# Patient Record
Sex: Male | Born: 1960 | Race: Black or African American | Hispanic: No | Marital: Single | Smoking: Current every day smoker
Health system: Southern US, Community
[De-identification: ages and names within clinical notes are randomized; demographics above are authoritative.]

## PROBLEM LIST (undated history)

## (undated) DIAGNOSIS — F1010 Alcohol abuse, uncomplicated: Secondary | ICD-10-CM

## (undated) DIAGNOSIS — C801 Malignant (primary) neoplasm, unspecified: Secondary | ICD-10-CM

## (undated) HISTORY — PX: ELBOW SURGERY: SHX618

---

## 1997-06-21 ENCOUNTER — Emergency Department (HOSPITAL_COMMUNITY): Admission: EM | Admit: 1997-06-21 | Discharge: 1997-06-21 | Payer: Self-pay | Admitting: Emergency Medicine

## 1998-04-29 ENCOUNTER — Emergency Department (HOSPITAL_COMMUNITY): Admission: EM | Admit: 1998-04-29 | Discharge: 1998-04-29 | Payer: Self-pay | Admitting: Emergency Medicine

## 1998-04-29 ENCOUNTER — Encounter: Payer: Self-pay | Admitting: *Deleted

## 1999-05-17 ENCOUNTER — Encounter: Payer: Self-pay | Admitting: Emergency Medicine

## 1999-05-17 ENCOUNTER — Inpatient Hospital Stay (HOSPITAL_COMMUNITY): Admission: EM | Admit: 1999-05-17 | Discharge: 1999-05-18 | Payer: Self-pay | Admitting: Emergency Medicine

## 2002-06-10 ENCOUNTER — Emergency Department (HOSPITAL_COMMUNITY): Admission: EM | Admit: 2002-06-10 | Discharge: 2002-06-10 | Payer: Self-pay | Admitting: Emergency Medicine

## 2005-12-09 ENCOUNTER — Emergency Department (HOSPITAL_COMMUNITY): Admission: EM | Admit: 2005-12-09 | Discharge: 2005-12-10 | Payer: Self-pay | Admitting: Emergency Medicine

## 2007-01-11 ENCOUNTER — Emergency Department (HOSPITAL_COMMUNITY): Admission: EM | Admit: 2007-01-11 | Discharge: 2007-01-11 | Payer: Self-pay | Admitting: Emergency Medicine

## 2008-11-09 ENCOUNTER — Emergency Department (HOSPITAL_COMMUNITY): Admission: EM | Admit: 2008-11-09 | Discharge: 2008-11-09 | Payer: Self-pay | Admitting: Emergency Medicine

## 2009-08-13 ENCOUNTER — Emergency Department (HOSPITAL_COMMUNITY): Admission: EM | Admit: 2009-08-13 | Discharge: 2009-08-13 | Payer: Self-pay | Admitting: Emergency Medicine

## 2010-05-20 LAB — POCT I-STAT, CHEM 8
BUN: 22 mg/dL (ref 6–23)
Calcium, Ion: 0.89 mmol/L — ABNORMAL LOW (ref 1.12–1.32)
Chloride: 93 mEq/L — ABNORMAL LOW (ref 96–112)
HCT: 56 % — ABNORMAL HIGH (ref 39.0–52.0)
Potassium: 3 mEq/L — ABNORMAL LOW (ref 3.5–5.1)

## 2010-05-20 LAB — RAPID URINE DRUG SCREEN, HOSP PERFORMED
Amphetamines: NOT DETECTED
Barbiturates: NOT DETECTED
Benzodiazepines: NOT DETECTED
Cocaine: NOT DETECTED
Opiates: NOT DETECTED

## 2010-05-20 LAB — CBC
Platelets: 92 10*3/uL — ABNORMAL LOW (ref 150–400)
RBC: 5.39 MIL/uL (ref 4.22–5.81)
WBC: 11.1 10*3/uL — ABNORMAL HIGH (ref 4.0–10.5)

## 2010-05-20 LAB — URINALYSIS, ROUTINE W REFLEX MICROSCOPIC
Glucose, UA: NEGATIVE mg/dL
Glucose, UA: NEGATIVE mg/dL
Glucose, UA: NEGATIVE mg/dL
Hgb urine dipstick: NEGATIVE
Hgb urine dipstick: NEGATIVE
Hgb urine dipstick: NEGATIVE
Ketones, ur: 15 mg/dL — AB
Ketones, ur: 15 mg/dL — AB
Protein, ur: 100 mg/dL — AB
Protein, ur: NEGATIVE mg/dL
Specific Gravity, Urine: 1.029 (ref 1.005–1.030)
Urobilinogen, UA: 1 mg/dL (ref 0.0–1.0)
pH: 5 (ref 5.0–8.0)

## 2010-05-20 LAB — URINE MICROSCOPIC-ADD ON

## 2010-05-20 LAB — DIFFERENTIAL
Basophils Relative: 1 % (ref 0–1)
Eosinophils Absolute: 0 10*3/uL (ref 0.0–0.7)
Lymphs Abs: 1.6 10*3/uL (ref 0.7–4.0)
Monocytes Relative: 10 % (ref 3–12)
Neutro Abs: 8.4 10*3/uL — ABNORMAL HIGH (ref 1.7–7.7)
Neutrophils Relative %: 75 % (ref 43–77)

## 2010-05-20 LAB — HEPATIC FUNCTION PANEL
AST: 117 U/L — ABNORMAL HIGH (ref 0–37)
Albumin: 3.9 g/dL (ref 3.5–5.2)
Alkaline Phosphatase: 101 U/L (ref 39–117)
Total Bilirubin: 3.1 mg/dL — ABNORMAL HIGH (ref 0.3–1.2)
Total Protein: 7.7 g/dL (ref 6.0–8.3)

## 2010-05-20 LAB — BASIC METABOLIC PANEL
BUN: 17 mg/dL (ref 6–23)
CO2: 34 mEq/L — ABNORMAL HIGH (ref 19–32)
Chloride: 97 mEq/L (ref 96–112)
Creatinine, Ser: 1.31 mg/dL (ref 0.4–1.5)
GFR calc Af Amer: 43 mL/min — ABNORMAL LOW (ref >60)
GFR calc Af Amer: >60 mL/min (ref >60)
GFR calc non Af Amer: 58 mL/min — ABNORMAL LOW (ref >60)
Potassium: 3 mEq/L — ABNORMAL LOW (ref 3.5–5.1)
Potassium: 3.2 mEq/L — ABNORMAL LOW (ref 3.5–5.1)

## 2010-05-20 LAB — LIPASE, BLOOD: Lipase: 70 U/L — ABNORMAL HIGH (ref 11–59)

## 2010-05-20 LAB — MAGNESIUM: Magnesium: 1.5 mg/dL (ref 1.5–2.5)

## 2010-06-07 LAB — COMPREHENSIVE METABOLIC PANEL
ALT: 100 U/L — ABNORMAL HIGH (ref 0–53)
Albumin: 3.7 g/dL (ref 3.5–5.2)
Alkaline Phosphatase: 67 U/L (ref 39–117)
Potassium: 3.7 mEq/L (ref 3.5–5.1)
Sodium: 139 mEq/L (ref 135–145)
Total Protein: 7.1 g/dL (ref 6.0–8.3)

## 2010-06-07 LAB — DIFFERENTIAL
Basophils Relative: 1 % (ref 0–1)
Eosinophils Absolute: 0.1 10*3/uL (ref 0.0–0.7)
Eosinophils Relative: 2 % (ref 0–5)
Monocytes Absolute: 0.8 10*3/uL (ref 0.1–1.0)
Monocytes Relative: 12 % (ref 3–12)

## 2010-06-07 LAB — URINALYSIS, ROUTINE W REFLEX MICROSCOPIC
Glucose, UA: NEGATIVE mg/dL
Hgb urine dipstick: NEGATIVE
Ketones, ur: NEGATIVE mg/dL
Protein, ur: NEGATIVE mg/dL

## 2010-06-07 LAB — CBC
Hemoglobin: 16.7 g/dL (ref 13.0–17.0)
Platelets: 135 10*3/uL — ABNORMAL LOW (ref 150–400)
RDW: 14.8 % (ref 11.5–15.5)
WBC: 6.6 10*3/uL (ref 4.0–10.5)

## 2010-06-07 LAB — RAPID URINE DRUG SCREEN, HOSP PERFORMED
Amphetamines: NOT DETECTED
Barbiturates: NOT DETECTED
Benzodiazepines: NOT DETECTED

## 2010-06-07 LAB — ETHANOL: Alcohol, Ethyl (B): 376 mg/dL — ABNORMAL HIGH (ref 0–10)

## 2010-07-19 NOTE — Discharge Summary (Signed)
[place]. [facility]  Patient:    Ryan Mcfarland, Ryan Mcfarland                        MRN: [id number] Adm. Date:  [id number] Disc. Date: [id number] Attending:  Farley Ly Dictator:   Leory Plowman, M.D.                           Discharge Summary  ADDENDUM  ADDITIONAL DISCHARGE DIAGNOSES: 1. Thrombocytopenia. 2. Mild hyperbilirubinemia.  HOSPITAL COURSE: #1 - HYPERBILIRUBINEMIA:  [name] hyperbilirubinemia was found to be partially conjugated and partially unconjugated suggestive of intrahepatic cholestasis consistent with alcoholic hepatitis.  #2 - THROMBOCYTOPENIA:  The patients thrombocytopenia was presumed to be secondaryto chronic alcohol use and was unaccompanied by any signs or symptoms f bleeding.  Again, he is counseled on the importance of discontinuing his use of  alcohol. DD:  06/13/99 TD:  06/13/99 Job: [id number]

## 2010-07-19 NOTE — Discharge Summary (Signed)
[place]. [facility]  Patient:    Ryan Mcfarland, Ryan Mcfarland                        MRN: [id number] Adm. Date:  [id number] Disc. Date: [id number] Attending:  Farley Ly Dictator:   Leory Plowman, M.D. CC:         Leory Plowman, M.D./outpatient clinic                           Discharge Summary  DISCHARGE DIAGNOSES: 1. Alcoholic gastritis. 2. Alcoholic hepatitis. 3. Alcoholism. 4. Nausea and vomiting, resolved. 5. Mild hypokalemia.  DISCHARGE MEDICATIONS: 1. Pepcid 20 mg one p.o. b.i.d. 2. K-Dur 40 mEq one p.o. q.a.m. x 1.  FOLLOW-UP:  The patient is instructed to follow-up at the [facility] outpatient  clinic with Dr. Leory Plowman; at that time, it will be important to follow-up on his hepatitis serologies, which are pending at the time of discharge.  The patient is also encouraged to follow-up with [facility] or AA regarding his alcohol abuse.  BRIEF HISTORY & PHYSICAL:  Ryan Mcfarland is a 50 year old gentleman with a two-day  history of nausea and vomiting, resulting in the inability to keep down sufficient food or drink, and dizziness.  Has significant past medical history of alcohol abuse, drinking approximately 80 ounces of beer, and one bottle of wine daily.  PHYSICAL EXAMINATION:   At presentation, was significant for orthostatic hypotension and tachycardia and diffuse abdominal discomfort, worse in the epigastrium.  LABORATORY DATA:  Significant for hemoglobin 18.1 and AST 197, ALT 130.  HOSPITAL COURSE: 1. Nausea and vomiting:  The patient was admitted for IV fluid hydration, Phenergan administration and was initially NPO.  In addition, he was treated with Pepcid. By the first night of his hospital stay, the patient was ready for advancement to  liquid diet, which he tolerated well, and advanced to a regular diet prior to discharge.  His nausea and vomiting completely resolved, and all abdominal discomfort was gone.  His tachycardia  also resolved with hydration, and his blood pressure normalized.  2. Alcoholic gastritis:  Most likely, Ryan Mcfarland nausea and vomiting is due to  gastric irritation from large quantities of alcohol.  He is encouraged to no longer drink and was given a prescription for Pepcid which may help while his gastric mucosa heals.   3. Alcoholic hepatitis:  Ryan Mcfarland transaminase elevation on admission fell to an SGOT of 140 and an SGPT of 83 on the day of discharge.  Note that this ratio  continues to be suggestive of alcoholic hepatitis.  In spite, hepatitis serologies were ordered, which are pending at the time of discharge.  These will be followed up when the patient presents for hospital follow-up in my clinic.  4. Hypokalemia:  The patients potassium was 3.2 on the day of discharge.  He was given potassium orally, both prior to discharge and to take as an outpatient the next morning.  5. Alcoholism:  Ryan Mcfarland had no insight into his problem with alcoholism, but  when it was addressed, he did express interest in following up with [facility] or Alcoholics Anonymous.  DISCHARGE LABORATORY:  Sodium 136, potassium 3.2, chloride 102, bicarb 30, BUN , creatinine 0.9, glucose 105, SGOT 140, SGPT 83, alk phos 92, Total bili 2.4.  White count 7.2, hemoglobin 13.7, platelets 80,000.  DISPOSITION:  Ryan Mcfarland is discharged to home.  CONDITION:  Improved. DD:  06/13/99 TD:  06/13/99 Job: [id number]

## 2011-06-27 ENCOUNTER — Emergency Department (HOSPITAL_COMMUNITY): Payer: Self-pay

## 2011-06-27 ENCOUNTER — Inpatient Hospital Stay (HOSPITAL_COMMUNITY)
Admission: EM | Admit: 2011-06-27 | Discharge: 2011-06-28 | DRG: 641 | Disposition: A | Discharge status: Home / Self Care | Payer: Self-pay | Attending: Internal Medicine | Admitting: Internal Medicine

## 2011-06-27 ENCOUNTER — Encounter (HOSPITAL_COMMUNITY): Payer: Self-pay | Admitting: *Deleted

## 2011-06-27 DIAGNOSIS — E860 Dehydration: Principal | ICD-10-CM | POA: Diagnosis present

## 2011-06-27 DIAGNOSIS — F1020 Alcohol dependence, uncomplicated: Secondary | ICD-10-CM | POA: Diagnosis present

## 2011-06-27 DIAGNOSIS — R42 Dizziness and giddiness: Secondary | ICD-10-CM | POA: Diagnosis present

## 2011-06-27 DIAGNOSIS — F10239 Alcohol dependence with withdrawal, unspecified: Secondary | ICD-10-CM

## 2011-06-27 DIAGNOSIS — F1010 Alcohol abuse, uncomplicated: Secondary | ICD-10-CM | POA: Diagnosis present

## 2011-06-27 DIAGNOSIS — M6282 Rhabdomyolysis: Secondary | ICD-10-CM | POA: Diagnosis present

## 2011-06-27 HISTORY — DX: Alcohol abuse, uncomplicated: F10.10

## 2011-06-27 LAB — BLOOD GAS, ARTERIAL
Acid-Base Excess: 1.6 mmol/L (ref 0.0–2.0)
Bicarbonate: 24.9 mEq/L — ABNORMAL HIGH (ref 20.0–24.0)
pCO2 arterial: 33.8 mmHg — ABNORMAL LOW (ref 35.0–45.0)
pO2, Arterial: 85.5 mmHg (ref 80.0–100.0)

## 2011-06-27 LAB — URINALYSIS, ROUTINE W REFLEX MICROSCOPIC
Bilirubin Urine: NEGATIVE
Glucose, UA: NEGATIVE mg/dL
Hgb urine dipstick: NEGATIVE
Nitrite: NEGATIVE
Specific Gravity, Urine: 1.025 (ref 1.005–1.030)
pH: 6 (ref 5.0–8.0)

## 2011-06-27 LAB — COMPREHENSIVE METABOLIC PANEL
ALT: 37 U/L (ref 0–53)
AST: 112 U/L — ABNORMAL HIGH (ref 0–37)
Albumin: 4.3 g/dL (ref 3.5–5.2)
Alkaline Phosphatase: 72 U/L (ref 39–117)
CO2: 21 mEq/L (ref 19–32)
Chloride: 98 mEq/L (ref 96–112)
Potassium: 3.7 mEq/L (ref 3.5–5.1)
Total Bilirubin: 1.1 mg/dL (ref 0.3–1.2)

## 2011-06-27 LAB — CBC
Hemoglobin: 16.2 g/dL (ref 13.0–17.0)
MCHC: 35 g/dL (ref 30.0–36.0)
RDW: 13.7 % (ref 11.5–15.5)
WBC: 9.9 10*3/uL (ref 4.0–10.5)

## 2011-06-27 LAB — LACTIC ACID, PLASMA
Lactic Acid, Venous: 8.1 mmol/L — ABNORMAL HIGH (ref 0.5–2.2)
Lactic Acid, Venous: 3.1 mmol/L — ABNORMAL HIGH (ref 0.5–2.2)

## 2011-06-27 LAB — DIFFERENTIAL
Basophils Absolute: 0.1 10*3/uL (ref 0.0–0.1)
Basophils Relative: 1 % (ref 0–1)
Lymphocytes Relative: 7 % — ABNORMAL LOW (ref 12–46)
Neutro Abs: 7.4 10*3/uL (ref 1.7–7.7)
Neutrophils Relative %: 75 % (ref 43–77)

## 2011-06-27 LAB — RAPID URINE DRUG SCREEN, HOSP PERFORMED
Amphetamines: NOT DETECTED
Benzodiazepines: NOT DETECTED
Cocaine: POSITIVE — AB
Opiates: NOT DETECTED

## 2011-06-27 LAB — POCT I-STAT TROPONIN I

## 2011-06-27 MED ORDER — SODIUM CHLORIDE 0.9 % IV BOLUS (SEPSIS)
1000.0000 mL | Freq: Once | INTRAVENOUS | Status: AC
  Administered 2011-06-27 (×2): 1000 mL via INTRAVENOUS

## 2011-06-27 MED ORDER — LORAZEPAM 2 MG/ML IJ SOLN
1.0000 mg | Freq: Once | INTRAMUSCULAR | Status: AC
  Administered 2011-06-27: 1 mg via INTRAVENOUS
  Filled 2011-06-27: qty 1

## 2011-06-27 MED ORDER — HEPARIN SODIUM (PORCINE) 5000 UNIT/ML IJ SOLN
5000.0000 [IU] | Freq: Three times a day (TID) | INTRAMUSCULAR | Status: DC
  Administered 2011-06-27 – 2011-06-28 (×2): 5000 [IU] via SUBCUTANEOUS
  Filled 2011-06-27 (×8): qty 1

## 2011-06-27 MED ORDER — SODIUM CHLORIDE 0.9 % IV BOLUS (SEPSIS): 1000.0000 mL | Freq: Once | INTRAVENOUS | Status: DC

## 2011-06-27 MED ORDER — ONDANSETRON HCL 4 MG/2ML IJ SOLN: 4.0000 mg | Freq: Four times a day (QID) | INTRAMUSCULAR | Status: DC | PRN

## 2011-06-27 MED ORDER — KCL IN DEXTROSE-NACL 20-5-0.9 MEQ/L-%-% IV SOLN
INTRAVENOUS | Status: DC
  Filled 2011-06-27 (×5): qty 1000

## 2011-06-27 MED ORDER — ALUM & MAG HYDROXIDE-SIMETH 200-200-20 MG/5ML PO SUSP: 30.0000 mL | Freq: Four times a day (QID) | ORAL | Status: DC | PRN

## 2011-06-27 MED ORDER — KCL IN DEXTROSE-NACL 20-5-0.9 MEQ/L-%-% IV SOLN
INTRAVENOUS | Status: DC
  Filled 2011-06-27 (×4): qty 1000

## 2011-06-27 MED ORDER — ONDANSETRON HCL 4 MG PO TABS: 4.0000 mg | ORAL_TABLET | Freq: Four times a day (QID) | ORAL | Status: DC | PRN

## 2011-06-27 MED ORDER — LORAZEPAM 2 MG/ML IJ SOLN: 1.0000 mg | INTRAMUSCULAR | Status: DC | PRN

## 2011-06-27 MED ORDER — THIAMINE HCL 100 MG/ML IJ SOLN
Freq: Once | INTRAVENOUS | Status: AC
  Administered 2011-06-28: 06:00:00 via INTRAVENOUS
  Filled 2011-06-27: qty 1000

## 2011-06-27 NOTE — H&P (Signed)
Ryan Mcfarland MRN: [id number] DOB/AGE: 51/23/1962 51 y.o.  Admit date: 06/27/2011 Chief Complaint: Dizziness, lightheadedness. HPI: This 51 year old man, who has a history of alcohol abuse and drinks 1 L of wine every day, presents with the above symptoms. The symptoms started today when he was trying to walk from her [place] to [place]. He apparently was going to see some friends in [place]. He was nauseous and vomited. He denies any abdominal pain. There was no real loss of consciousness. When he presented to the emergency room, he looked dehydrated and has been given intravenous fluids. He is now being referred to the hospitalist for admission in view of ongoing dehydration and mild rhabdomyolysis.  Past Medical History  Diagnosis Date  . ETOH abuse    History reviewed. No pertinent past surgical history.      No family history on file.  Social History:  reports that he has been smoking.  He does not have any smokeless tobacco history on file. He reports that he drinks alcohol. He reports that he does not use illicit drugs. He apparently lives alone. He abuses alcohol.  Allergies: No Known Allergies       WUJ:WJXBJ from the symptoms mentioned above,there are no other symptoms referable to all systems reviewed.  Physical Exam: Blood pressure 146/86, pulse 90, temperature 99.3 F (37.4 C), temperature source Rectal, resp. rate 20, height 5\' 6"  (1.676 m), weight 65.772 kg (145 lb), SpO2 98.00%. He looks clinically dehydrated. He has a cyst on the left side of his neck. He has some marks on his face which looked like burn marks. Heart sounds are present and normal. Lung fields are clear. Abdomen is soft and nontender. There is no hepatosplenomegaly. He is alert and orientated without any focal neurologic signs. At the present time is not delirious or confused and does not appear to have any signs of alcohol withdrawal.    Basename 06/27/11 1455  WBC 9.9  NEUTROABS 7.4    HGB 16.2  HCT 46.3  MCV 94.1  PLT 159    Basename 06/27/11 1455  NA 141  K 3.7  CL 98  CO2 21  GLUCOSE 99  BUN 6  CREATININE 0.80  CALCIUM 9.7  MG --         Dg Chest Port 1 View  06/27/2011  *RADIOLOGY REPORT*  Clinical Data: Dizziness.  Cough.  Vomiting.  Smoker.  PORTABLE CHEST - 1 VIEW  Comparison: None.  Findings: Normal sized heart.  Clear lungs.  The lungs appear mildly hyperexpanded with minimally prominent interstitial markings.  Unremarkable bones.  IMPRESSION: Mild changes of COPD.  No acute abnormality.  Original Report Authenticated By: Darrol Angel, M.D.   Impression: 1. Dehydration. 2. Continuous alcohol abuse. 3. Mild rhabdomyolysis.    Plan: 1. Admit to medical floor. 2. Intravenous fluids. 3. Intravenous thiamine and multivitamins. 4. Folic acid. 5. Intravenous Ativan when necessary for withdrawal of alcohol symptoms. Further recommendations will depend on patient's hospital progress.      Wilson Singer Pager [phone]  06/27/2011, 5:46 PM

## 2011-06-27 NOTE — ED Notes (Addendum)
Pt presents to er via [facility] with c/o dizziness and weakness, pt states that he was attempting to walk to [place] from [place] go to [employer] when he began to experience dizziness and weakness. Pt denies any pain. Does admit to nausea with the dizziness. Pt also reports that he has been having cramps in both lower legs. Pt states that he normally has liter and a fifth of alcohol everyday but has not had anything today. Pt actively vomiting in triage

## 2011-06-27 NOTE — ED Notes (Signed)
Attempted to give report to floor. Pt nurse and charge nurse unavailable. Will continue to follow up.

## 2011-06-27 NOTE — ED Provider Notes (Signed)
History    This chart was scribed for Joya Gaskins, MD, MD by Smitty Pluck. The patient was seen in room APA02 and the patient's care was started at 2:34PM.   CSN: [id number]  Arrival date & time 06/27/11  1309   First MD Initiated Contact with Patient 06/27/11 1432      Chief Complaint  Patient presents with  . Dizziness     The history is provided by the patient.   Ryan Mcfarland is a 51 y.o. male who presents to the Emergency Department BIB EMS complaining of moderate dizziness onset today. Pt reports feeling like he would pass out. He denies LOC. He was walking from [place] to [place]. Pt reports walking for a couple of miles when symptoms started. Denies chest pain, SOB, headaches, cough and diarrhea. He reports having moderate bilateral leg pain/cramping. He reports nausea and vomiting today. He drinks alcohol about 1 L / day. Denies hx CVA and MI. Denies using illegal substances. Symptoms have been constant without radiation.  He reports he has not had ETOH since yesterday No focal weakness, just feel like "I might pass out" Rest improves his symptoms Walking worsens his symptoms  Past Medical History  Diagnosis Date  . ETOH abuse     History reviewed. No pertinent past surgical history.  No family history on file.  History  Substance Use Topics  . Smoking status: Current Everyday Smoker  . Smokeless tobacco: Not on file  . Alcohol Use: Yes      Review of Systems  All other systems reviewed and are negative.   10 Systems reviewed and all are negative for acute change except as noted in the HPI.   Allergies  Review of patient's allergies indicates no known allergies.  Home Medications  No current outpatient prescriptions on file.  BP 146/86  Pulse 90  Temp(Src) 98.8 F (37.1 C) (Oral)  Resp 20  Ht 5\' 6"  (1.676 m)  Wt 145 lb (65.772 kg)  BMI 23.40 kg/m2  SpO2 98%  Physical Exam  Nursing note and vitals reviewed.  CONSTITUTIONAL: Well  developed/well nourished HEAD AND FACE: Normocephalic/atraumatic EYES: EOMI/PERRL ENMT: Mucous membranes dry NECK: supple no meningeal signs.  He has raised area on his neck that is chronic, nontender SPINE:entire spine nontender CV: S1/S2 noted, no murmurs/rubs/gallops noted LUNGS: Lungs are clear to auscultation bilaterally, no apparent distress ABDOMEN: soft, nontender, no rebound or guarding GU:no cva tenderness NEURO: Pt is awake/alert, moves all extremitiesx4, tremor bilaterally,  EXTREMITIES: pulses normal, full ROM SKIN: warm, color normal PSYCH: no abnormalities of mood noted  ED Course  Procedures  DIAGNOSTIC STUDIES: Oxygen Saturation is 98% on room air, normal by my interpretation.    COORDINATION OF CARE: 2:43PM EDP discusses pt ED treatment course with pt.  2:46PM EDP ordered medication: NaCl 0.9% bolus and ativan 1 mg  4:03 PM Elevated lactate noted ,though afebrile, no distress, mentating appropriately.  His tremor is improved. Will recheck lactate after IV fluids.  Suspect etoh withdrawal with associated dehydration and early rhabdomyolysis 5:37 PM Labs improved, though still some dehydration, likely also alcohol withdrawal D/w dr Karilyn Cota, will admit patient Pt stable, agreeable with plan   Labs Reviewed  CBC  DIFFERENTIAL  COMPREHENSIVE METABOLIC PANEL  LIPASE, BLOOD  CK  URINALYSIS, ROUTINE W REFLEX MICROSCOPIC  ETHANOL  LACTIC ACID, PLASMA  URINE RAPID DRUG SCREEN (HOSP PERFORMED)     MDM  Nursing notes reviewed and considered in documentation xrays reviewed and considered All  labs/vitals reviewed and considered    Date: 06/27/2011  Rate: 110  Rhythm: sinus tachycardia  QRS Axis: right  Intervals: normal  ST/T Wave abnormalities: nonspecific ST changes  Conduction Disutrbances:LVH noted  Narrative Interpretation:   Old EKG Reviewed: none available at time of interpretation    I personally performed the services described in this  documentation, which was scribed in my presence. The recorded information has been reviewed and considered.           Joya Gaskins, MD 06/27/11 [phone]

## 2011-06-28 LAB — COMPREHENSIVE METABOLIC PANEL
AST: 72 U/L — ABNORMAL HIGH (ref 0–37)
Albumin: 3.6 g/dL (ref 3.5–5.2)
BUN: 4 mg/dL — ABNORMAL LOW (ref 6–23)
Chloride: 102 mEq/L (ref 96–112)
Creatinine, Ser: 0.65 mg/dL (ref 0.50–1.35)
Total Bilirubin: 1.4 mg/dL — ABNORMAL HIGH (ref 0.3–1.2)
Total Protein: 6.9 g/dL (ref 6.0–8.3)

## 2011-06-28 LAB — CBC
HCT: 44.7 % (ref 39.0–52.0)
Hemoglobin: 15.7 g/dL (ref 13.0–17.0)
MCH: 33 pg (ref 26.0–34.0)
MCV: 93.9 fL (ref 78.0–100.0)
Platelets: 102 10*3/uL — ABNORMAL LOW (ref 150–400)
RBC: 4.76 MIL/uL (ref 4.22–5.81)
WBC: 7.1 10*3/uL (ref 4.0–10.5)

## 2011-06-28 MED ORDER — M.V.I. ADULT IV INJ
INJECTION | INTRAVENOUS | Status: AC
  Filled 2011-06-28: qty 10

## 2011-06-28 MED ORDER — FOLIC ACID 5 MG/ML IJ SOLN
INTRAMUSCULAR | Status: AC
  Filled 2011-06-28: qty 0.2

## 2011-06-28 MED ORDER — THIAMINE HCL 100 MG/ML IJ SOLN
INTRAMUSCULAR | Status: AC
  Filled 2011-06-28: qty 2

## 2011-06-28 MED ORDER — LORAZEPAM 0.5 MG PO TABS: 0.5000 mg | ORAL_TABLET | Freq: Three times a day (TID) | ORAL | Status: AC

## 2011-06-28 NOTE — Discharge Summary (Signed)
Physician Discharge Summary  Patient ID: Ryan Mcfarland MRN: [id number] DOB/AGE: 08/15/1960 50 y.o.  Admit date: 06/27/2011 Discharge date: 06/28/2011    Discharge Diagnoses:  1. Dehydration, resolved. 2. Alcohol abuse, ongoing.   Medication List  As of 06/28/2011  9:45 AM   TAKE these medications         LORazepam 0.5 MG tablet   Commonly known as: ATIVAN   Take 1 tablet (0.5 mg total) by mouth every 8 (eight) hours.            Discharged Condition: Stable and improved.    Consults: None.  Significant Diagnostic Studies: Dg Chest Port 1 View  06/27/2011  *RADIOLOGY REPORT*  Clinical Data: Dizziness.  Cough.  Vomiting.  Smoker.  PORTABLE CHEST - 1 VIEW  Comparison: None.  Findings: Normal sized heart.  Clear lungs.  The lungs appear mildly hyperexpanded with minimally prominent interstitial markings.  Unremarkable bones.  IMPRESSION: Mild changes of COPD.  No acute abnormality.  Original Report Authenticated By: Darrol Angel, M.D.    Lab Results: Basic Metabolic Panel:  Basename 06/28/11 0634 06/27/11 1455  NA 136 141  K 3.4* 3.7  CL 102 98  CO2 24 21  GLUCOSE 102* 99  BUN 4* 6  CREATININE 0.65 0.80  CALCIUM 8.9 9.7  MG -- --  PHOS -- --   Liver Function Tests:  [facility] 06/28/11 0634 06/27/11 1455  AST 72* 112*  ALT 29 37  ALKPHOS 63 72  BILITOT 1.4* 1.1  PROT 6.9 7.8  ALBUMIN 3.6 4.3     CBC:  Basename 06/28/11 0634 06/27/11 1455  WBC 7.1 9.9  NEUTROABS -- 7.4  HGB 15.7 16.2  HCT 44.7 46.3  MCV 93.9 94.1  PLT 102* 159       Hospital Course: This 51 year old man was admitted with symptoms of nausea, vomiting and dehydration. He has a history of alcohol abuse and yesterday was trying to walk from result [place] upon which he became somewhat dehydrated, resulting in hospitalization. On admission, his hemoglobin was slightly increased but his BUN and creatinine within normal range. His AST and ALT were elevated. His total CK was also  somewhat elevated at 1381. He was given intravenous fluids overnight and a banana bag. He is feeling improved now. He does not have any symptoms of alcohol withdrawal at the present time.  Discharge Exam: Blood pressure 159/82, pulse 60, temperature 98.8 F (37.1 C), temperature source Oral, resp. rate 18, height 5\' 6"  (1.676 m), weight 65.772 kg (145 lb), SpO2 98.00%. He looks systemically well. He is alert and orientated. Heart sounds are present and normal. Lung fields are clear. Abdomen is soft nontender. He is not delirious. There is no tremulousness.  Disposition: Home. I've given him a  prescription of Ativan in case he does have withdrawal symptoms. I strongly urged him to  seek help regarding his alcoholism.  Discharge Orders    Future Orders Please Complete By Expires   Diet - low sodium heart healthy      Increase activity slowly           SignedWilson Singer Pager [phone]  06/28/2011, 9:45 AM

## 2011-06-28 NOTE — Discharge Planning (Signed)
Patient is being discharged home accompanied by niece.  Patient does not have a PCP, and he has been advised to visit the free clinic [place], [address] for any needed medical attention.  Educated patient on the importance of discontinuing/decrease the use of alcohol.  Patient given information on Alcoholics Anonymous at [facility] in [place], [place] which meets every Wednesday night in the cafeteria at 8:00pm.  The phone number was also provided to him.  Pt given a prescription for Ativan q 8 hours #10 with no rf's.  He is encouraged to seek medical attention at the free clinic for any future medical treatment/needs due to his non-insured status.  Patient states understanding of all instructions and denies complaints.  He is discharged in stable condition.

## 2011-07-17 NOTE — Progress Notes (Signed)
UR Chart Review Completed  

## 2014-08-03 ENCOUNTER — Emergency Department (HOSPITAL_COMMUNITY)
Admission: EM | Admit: 2014-08-03 | Discharge: 2014-08-03 | Disposition: A | Discharge status: Home / Self Care | Payer: Medicaid Other | Attending: Emergency Medicine | Admitting: Emergency Medicine

## 2014-08-03 ENCOUNTER — Encounter (HOSPITAL_COMMUNITY): Payer: Self-pay

## 2014-08-03 ENCOUNTER — Emergency Department (HOSPITAL_COMMUNITY): Payer: Medicaid Other

## 2014-08-03 DIAGNOSIS — Z720 Tobacco use: Secondary | ICD-10-CM | POA: Diagnosis not present

## 2014-08-03 DIAGNOSIS — K029 Dental caries, unspecified: Secondary | ICD-10-CM | POA: Insufficient documentation

## 2014-08-03 DIAGNOSIS — K088 Other specified disorders of teeth and supporting structures: Secondary | ICD-10-CM | POA: Diagnosis present

## 2014-08-03 DIAGNOSIS — K0889 Other specified disorders of teeth and supporting structures: Secondary | ICD-10-CM

## 2014-08-03 LAB — BASIC METABOLIC PANEL
Anion gap: 10 (ref 5–15)
BUN: 8 mg/dL (ref 6–20)
CALCIUM: 9.1 mg/dL (ref 8.9–10.3)
CO2: 26 mmol/L (ref 22–32)
CREATININE: 0.72 mg/dL (ref 0.61–1.24)
Chloride: 100 mmol/L — ABNORMAL LOW (ref 101–111)
GFR calc Af Amer: >60 mL/min (ref >60)
GLUCOSE: 108 mg/dL — AB (ref 65–99)
Potassium: 3.8 mmol/L (ref 3.5–5.1)
Sodium: 136 mmol/L (ref 135–145)

## 2014-08-03 LAB — CBC WITH DIFFERENTIAL/PLATELET
BASOS PCT: 1 % (ref 0–1)
Basophils Absolute: 0.1 10*3/uL (ref 0.0–0.1)
EOS ABS: 0.1 10*3/uL (ref 0.0–0.7)
EOS PCT: 1 % (ref 0–5)
HCT: 40.9 % (ref 39.0–52.0)
HEMOGLOBIN: 14.1 g/dL (ref 13.0–17.0)
LYMPHS PCT: 14 % (ref 12–46)
Lymphs Abs: 1 10*3/uL (ref 0.7–4.0)
MCH: 31.3 pg (ref 26.0–34.0)
MCHC: 34.5 g/dL (ref 30.0–36.0)
MCV: 90.7 fL (ref 78.0–100.0)
MONO ABS: 1 10*3/uL (ref 0.1–1.0)
MONOS PCT: 13 % — AB (ref 3–12)
NEUTROS PCT: 71 % (ref 43–77)
Neutro Abs: 5.1 10*3/uL (ref 1.7–7.7)
PLATELETS: 190 10*3/uL (ref 150–400)
RBC: 4.51 MIL/uL (ref 4.22–5.81)
RDW: 13 % (ref 11.5–15.5)
WBC: 7.2 10*3/uL (ref 4.0–10.5)

## 2014-08-03 MED ORDER — NAPROXEN 500 MG PO TABS: 500.0000 mg | ORAL_TABLET | Freq: Two times a day (BID) | ORAL | Status: DC

## 2014-08-03 MED ORDER — CLINDAMYCIN PHOSPHATE 900 MG/50ML IV SOLN
900.0000 mg | Freq: Once | INTRAVENOUS | Status: AC
  Administered 2014-08-03: 900 mg via INTRAVENOUS
  Filled 2014-08-03: qty 50

## 2014-08-03 MED ORDER — IOHEXOL 300 MG/ML  SOLN
75.0000 mL | Freq: Once | INTRAMUSCULAR | Status: AC | PRN
  Administered 2014-08-03: 75 mL via INTRAVENOUS

## 2014-08-03 MED ORDER — CLINDAMYCIN HCL 150 MG PO CAPS: 300.0000 mg | ORAL_CAPSULE | Freq: Four times a day (QID) | ORAL | Status: DC

## 2014-08-03 NOTE — ED Notes (Signed)
Pt c/o toothache x 2 weeks and now has swelling in lips and gums and unable to eat.

## 2014-08-03 NOTE — Discharge Instructions (Signed)
Dental Pain °Toothache is pain in or around a tooth. It may get worse with chewing or with cold or heat.  °HOME CARE °· Your dentist may use a numbing medicine during treatment. If so, you may need to avoid eating until the medicine wears off. Ask your dentist about this. °· Only take medicine as told by your dentist or doctor. °· Avoid chewing food near the painful tooth until after all treatment is done. Ask your dentist about this. °GET HELP RIGHT AWAY IF:  °· The problem gets worse or new problems appear. °· You have a fever. °· There is redness and puffiness (swelling) of the face, jaw, or neck. °· You cannot open your mouth. °· There is pain in the jaw. °· There is very bad pain that is not helped by medicine. °MAKE SURE YOU:  °· Understand these instructions. °· Will watch your condition. °· Will get help right away if you are not doing well or get worse. °Document Released: 08/06/2007 Document Revised: 05/12/2011 Document Reviewed: 08/06/2007 °ExitCare® Patient Information ©2015 [employer]. This information is not intended to replace advice given to you by your health care provider. Make sure you discuss any questions you have with your health care provider. ° ° ° °Emergency Department Resource Guide °1) Find a Doctor and Pay Out of Pocket °Although you won't have to find out who is covered by your insurance plan, it is a good idea to ask around and get recommendations. You will then need to call the office and see if the doctor you have chosen will accept you as a new patient and what types of options they offer for patients who are self-pay. Some doctors offer discounts or will set up payment plans for their patients who do not have insurance, but you will need to ask so you aren't surprised when you get to your appointment. ° °2) Contact Your Local Health Department °Not all health departments have doctors that can see patients for sick visits, but many do, so it is worth a call to see if yours does.  If you don't know where your local health department is, you can check in your phone book. The CDC also has a tool to help you locate your state's health department, and many state websites also have listings of all of their local health departments. ° °3) Find a Walk-in Clinic °If your illness is not likely to be very severe or complicated, you may want to try a walk in clinic. These are popping up all over the country in pharmacies, drugstores, and shopping centers. They're usually staffed by nurse practitioners or physician assistants that have been trained to treat common illnesses and complaints. They're usually fairly quick and inexpensive. However, if you have serious medical issues or chronic medical problems, these are probably not your best option. ° °No Primary Care Doctor: °- Call [facility] at  832-8000 - they can help you locate a primary care doctor that  accepts your insurance, provides certain services, etc. °- Physician Referral Service- 1-800-533-3463 ° °Chronic Pain Problems: °Organization         Address  Phone   Notes  °[facility]  (336) 297-2271 Patients need to be referred by their primary care doctor.  ° °Medication Assistance: °Organization         Address  Phone   Notes  °[place] [facility] [address] °[place], [place] [address] (336) 641-8030 --Must be a resident   of [place] °-- Must have NO insurance coverage whatsoever (no Medicaid/ Medicare, etc.) °-- The pt. MUST have a primary care doctor that directs their care regularly and follows them in the community °  °[facility]  (866) 331-1348   °[employer]  (888) 892-1162   ° °Agencies that provide inexpensive medical care: °Organization         Address  Phone   Notes  °[facility]  (336) 832-8035   °[facility]    (336) 832-7272   °[facility] [address] °[place], [place] [address] (336) 832-4777   °[facility] [place] [address], °[place] (336) 271-4999   °[facility]    (336) 373-0678   °[facility]    (336) 272-1050   °[facility] ° [address], [place] Phone:  (336) 832-4444, Fax:  (336) 832-4440 Hours of Operation:  9 am - 6 pm, M-F.  Also accepts Medicaid/Medicare and self-pay.  °[facility] ° [address], [place] Phone: (336) 832-3150, Fax: (336) 832-3151. Hours of Operation:  8:30 am - 5:30 pm, M-F.  Also accepts Medicaid and self-pay.  °[facility] [place] [address], [place] Phone: (336) 878-6027   °[facility] [address], [place], [place] (336)723-1848, Ext. 123 Mondays & Thursdays: 7-9 AM.  First 15 patients are seen on a first come, first serve basis. °  ° °Medicaid-accepting [place] Providers: ° °Organization         Address  Phone   Notes  °[facility] [address], [place] (336) 641-2100 Also accepts self-pay patients.  °[facility] [address], [place] ° (336) 856-9996   °[facility] [address], [place] (336) 288-8857   °[facility] [address] [place] [address], [place] (336) 299-7000   °Veita Bland [address], [place]  ° (336) 373-1557 Only accepts [place] Access Medicaid patients after they have their name applied to their card.  ° °Self-Pay (no insurance) in [place]: ° °Organization         Address  Phone   Notes  °Sickle Cell Patients, [facility] [address], [place] (336) 832-1970   °[facility] [address], [place] (336) 832-4400   °[facility] ° [address], [place] (336) 992-4800   °[facility]/Dr. Osei-Bonsu ° [address] [place] [address], [place] or [address], [place] (336) 841-8500  Phone number for both [place] and [place] locations is the same.  °[facility] [address], [place] (336) 299-0000   °[facility] [address] [place] [address],  or [address] (336) 852-7530 °(336) 878-2260   °[facility] [address],  (336) 350-1642, phone; (336) 294-5005, fax Sees patients 1st and 3rd Saturday of every month.  Must not qualify for public or private insurance (i.e. Medicaid, Medicare, [place] Health Choice, Veterans' Benefits) • Household income should be no more than 200% of the poverty level •The clinic cannot treat you if you are pregnant or think you are pregnant • Sexually transmitted diseases are not treated at the clinic.  ° ° °Dental Care: °Organization         Address    Phone  Notes  °[facility] [address], [place] (336) 641-6152 Accepts children up to age 21 who are enrolled in Medicaid or [place] Health Choice; pregnant women with a Medicaid card; and children who have applied for Medicaid or [place] Health Choice, but were declined, whose parents can pay a reduced fee at time of service.  °[facility] [place]  [address], [place] (336) 641-7733 Accepts children up to age 21 who are enrolled in Medicaid or [place] Health Choice; pregnant women with a Medicaid card; and children who have applied for Medicaid or [place] Health Choice, but were declined, whose parents can pay a reduced fee at time of service.  °[facility] ° [address], [place] (336) 641-4533 Patients are seen by appointment only. Walk-ins are not accepted. [facility] will see patients 18 years of age and older. °Monday - Tuesday (8am-5pm) °Most Wednesdays (8:30-5pm) °$30 per visit, cash only  °[facility] ° [address], [place] (336) 641-4533 Patients are seen by appointment  only. Walk-ins are not accepted. [facility] will see patients 18 years of age and older. °One Wednesday Evening (Monthly: Volunteer Based).  $30 per visit, cash only  °[facility]  (919) 537-3737 for adults; Children under age 4, call Graduate Pediatric Dentistry at (919) [phone]. Children aged 4-14, please call (919) [phone] to request a pediatric application. ° Dental services are provided in all areas of dental care including fillings, crowns and bridges, complete and partial dentures, implants, gum treatment, root canals, and extractions. Preventive care is also provided. Treatment is provided to both adults and children. °Patients are selected via a lottery and there is often a waiting list. °  °[facility] [address], °[place] ° (336) 763-8833 [email or web address] °  °[facility] [address], [place], [place] (336)723-1848, Ext. 123 Second and Fourth Thursday of each month, opens at 6:30 AM; Clinic ends at 9 AM.  Patients are seen on a first-come first-served basis, and a limited number are seen during each clinic.  ° °[facility] ° [address], [place], [place] (336) 723-7904   Eligibility Requirements °You must have lived in [place], [place], or [place] counties for at least the last three months. °  You cannot be eligible for state or federal sponsored healthcare insurance, including Veterans Administration, Medicaid, or Medicare. °  You generally cannot be eligible for healthcare insurance through your employer.  °  How to apply: °Eligibility screenings are held every Tuesday and Wednesday afternoon from 1:00 pm until 4:00 pm. You do not need an appointment for the interview!  °[facility] [address], [place], [place] 336-631-2330   °[facility]  336-342-8273   °[facility]  336-703-3100   °[facility]  336-570-6415   ° °Behavioral Health  Resources in the Community: °Intensive Outpatient Programs °Organization         Address  Phone  Notes  °[facility] [address], [place], [place] 336-878-6098   °[facility] [address], [place], [place] 336-832-9800   °[facility] [address], [place],  ° 336-882-2125   °[facility] [address],  °,  1-800-853-5163 or 336-641-4981   °Substance Abuse Resources °Organization           Address  Phone  Notes  °[facility]  336-882-2125   °[facility]  336-784-9470   °[facility]  336-285-9073   °[facility]  336-845-3988   °Residential & Outpatient Substance Abuse Program  1-800-659-3381   °Psychological Services °Organization         Address  Phone  Notes  °[facility]  336- 832-9600   °[facility]  336- 378-7881   °[facility] [address], [place] 1-800-853-5163 or 336-641-4981   ° °Mobile Crisis Teams °Organization         Address  Phone  Notes  °[facility], Mobile Crisis Care Unit  1-877-626-1772   °Assertive °[facility] ° [address], [place] 336-834-9664   °Sharon DeEsch [address] °[place] 336-554-5454   ° °Self-Help/Support Groups °Organization         Address  Phone             Notes  °[facility] [place] - variety of support groups  336- 373-1402 Call for more information  °Narcotics Anonymous (NA), [facility] [address], °[place]  2 meetings at this location  ° °Residential Treatment Programs °Organization         Address  Phone  Notes  °[facility] [address],    °[place]  1-866-801-8205   °[facility] ° [address], [place], [place] 704-293-8524   °[facility] [address], [place] 336-845-3988 Admissions: 8am-3pm M-F  °[facility] [address]    °[place], [place] 336-841-1104   °[facility] [address], [place], [place] 336-379-7146   °[facility] [address]  °[place], [place] 336-285-9073   °[facility] Intensive Outpatient [address], [place], [place] 336-852-3033   °[facility] [address]  °[place], [place] 1-877-615-2722 or 336-784-9470   °[facility] [address] [place], [place] 336-227-7417 Accepts Medicaid  °[facility] [address]  °[place] 1-800-659-3381 Substance Abuse/Addiction Treatment  ° °[place] Behavioral Health Resources °Organization         Address  Phone  Notes  °[facility]  (888) 581-9988   °Julie Brannon, PhD [address] [place], [place]   (336) 349-5553 or (336) 951-0000   °[facility]   [address] °[place], [place] (336) 349-4454   °[facility] [address], [place], [place] (336) 342-8316 Insurance/Medicaid/sponsorship through [facility]  °[facility] [address]                                    [place],  (336) 342-8316 Therapy/tele-psych/case  °[facility] [address]  ° [place],  (336) 349-2233    °Dr. Arfeen  (336) 349-4544   °[facility] [place]  [employer] [place] [facility] 1) [address],  °2) [address], [place] °3)  [address], [place] (336) 349-3220 °(336) 342-7768 ° °(336) 342-8140   °[place] [facility] (336) 342-1394 or (336) 342-3537 (After Hours)    ° °  °

## 2014-08-05 NOTE — ED Provider Notes (Signed)
CSN: [id number]     Arrival date & time 08/03/14  1012 History   First MD Initiated Contact with Patient 08/03/14 1025     Chief Complaint  Patient presents with  . Dental Pain     (Consider location/radiation/quality/duration/timing/severity/associated sxs/prior Treatment) HPI   Ryan Mcfarland is a 54 y.o. male who presents to the Emergency Department complaining of dental pain, swelling and pain to his chin, lower lip and under his tongue.  He states the pain has been present for 2 weeks.  He describes pain with movement of his tongue and chewing.  He denies fever, neck pain, vomiting, or bleeding of his gums.  He denies any previous dental care.   Past Medical History  Diagnosis Date  . ETOH abuse    History reviewed. No pertinent past surgical history. No family history on file. History  Substance Use Topics  . Smoking status: Current Every Day Smoker -- 1.50 packs/day for 20 years    Types: Cigarettes  . Smokeless tobacco: Not on file  . Alcohol Use: 3.6 oz/week    6 Glasses of wine per week     Comment: heavily daily    Review of Systems  Constitutional: Negative for fever and appetite change.  HENT: Positive for dental problem and facial swelling. Negative for congestion, sore throat and trouble swallowing.   Eyes: Negative for pain and visual disturbance.  Respiratory: Negative for shortness of breath.   Gastrointestinal: Negative for vomiting and abdominal pain.  Musculoskeletal: Negative for neck pain and neck stiffness.  Neurological: Negative for dizziness, facial asymmetry and headaches.  Hematological: Negative for adenopathy.  All other systems reviewed and are negative.     Allergies  Review of patient's allergies indicates no known allergies.  Home Medications   Prior to Admission medications   Medication Sig Start Date End Date Taking? Authorizing Provider  clindamycin (CLEOCIN) 150 MG capsule Take 2 capsules (300 mg total) by mouth 4 (four) times  daily. For 7 days 08/03/14   Eshawn Coor, PA-C  naproxen (NAPROSYN) 500 MG tablet Take 1 tablet (500 mg total) by mouth 2 (two) times daily with a meal. 08/03/14   Isyss Espinal, PA-C   BP 116/81 mmHg  Pulse 78  Temp(Src) 98.2 F (36.8 C) (Oral)  Resp 16  Ht 5\' 10"  (1.778 m)  Wt 135 lb (61.236 kg)  BMI 19.37 kg/m2  SpO2 100% Physical Exam  Constitutional: He is oriented to person, place, and time. He appears well-developed and well-nourished. No distress.  HENT:  Head: Normocephalic and atraumatic.  Right Ear: Tympanic membrane and ear canal normal.  Left Ear: Tympanic membrane and ear canal normal.  Mouth/Throat: Uvula is midline, oropharynx is clear and moist and mucous membranes are normal. No trismus in the jaw. Dental caries present. No dental abscesses or uvula swelling.   Mild edema of the lower anterior chin.  Widespread dental decay and periodontal disease with ttp of the lower central and lateral incisors.  Pain with elevation of the tongue, tenderness of the sublingual area.   Neck: Normal range of motion. Neck supple.  Cardiovascular: Normal rate, regular rhythm and normal heart sounds.   No murmur heard. Pulmonary/Chest: Effort normal and breath sounds normal. No respiratory distress.  Musculoskeletal: Normal range of motion.  Lymphadenopathy:    He has no cervical adenopathy.  Neurological: He is alert and oriented to person, place, and time. He exhibits normal muscle tone. Coordination normal.  Skin: Skin is warm and dry.  Nursing note and vitals reviewed.   ED Course  Procedures (including critical care time) Labs Review Labs Reviewed  CBC WITH DIFFERENTIAL/PLATELET - Abnormal; Notable for the following:    Monocytes Relative 13 (*)    All other components within normal limits  BASIC METABOLIC PANEL - Abnormal; Notable for the following:    Chloride 100 (*)    Glucose, Bld 108 (*)    All other components within normal limits    Imaging Review Ct Soft  Tissue Neck W Contrast  08/03/2014   CLINICAL DATA:  Dental pain.  EXAM: CT NECK WITH CONTRAST  TECHNIQUE: Multidetector CT imaging of the neck was performed using the standard protocol following the bolus administration of intravenous contrast.  CONTRAST:  64mL OMNIPAQUE IOHEXOL 300 MG/ML  SOLN  COMPARISON:  None.  FINDINGS: Pharynx and larynx: No definite abnormality seen.  Salivary glands: Parotid and submandibular glands appear normal.  Thyroid: Normal.  Lymph nodes: No significantly enlarged adenopathy is noted.  Vascular: No definite abnormality seen.  Limited intracranial: No definite abnormality seen.  Visualized orbits: Visualized portions appear normal.  Mastoids and visualized paranasal sinuses: No significant abnormality seen.  Skeleton: Degenerative disc disease is noted at C5-6, C6-7 and C7-T1.  Upper chest: No significant abnormality seen.  3.3 x 2.3 cm subcutaneous cyst seen superficial to left sternocleidomastoid muscle.  There appears to be lytic destruction involving the superior aspect of the anterior mandible consistent with dental infection. Inflammatory changes are seen in the adjacent soft tissues, but no defined fluid collection or abscess is noted.  IMPRESSION: Multilevel degenerative disc disease seen in lower cervical spine.  3.3 cm subcutaneous cyst seen superficial to left sternocleidomastoid muscle.  Lytic destruction is seen involving the anterior mandible around the base of the teeth consistent with severe dental infection with surrounding inflammation. However, no definite abscess is noted.   Electronically Signed   By: Marijo Conception, M.D.   On: 08/03/2014 13:23     EKG Interpretation None      MDM   Final diagnoses:  Pain, dental    CT scan is neg for Ludwig's angina.  Pt remains non-toxic appearing.  Airway patent.  Has rec IV clindamycin and rx written for same.  Pt given dental referral info.  Agrees to arrange close f/u    Kem Parkinson, PA-C 08/05/14  2215  Davonna Belling, MD 08/07/14 1452

## 2014-11-03 ENCOUNTER — Emergency Department (HOSPITAL_COMMUNITY): Payer: Medicaid Other

## 2014-11-03 ENCOUNTER — Encounter (HOSPITAL_COMMUNITY): Payer: Self-pay | Admitting: Emergency Medicine

## 2014-11-03 ENCOUNTER — Emergency Department (HOSPITAL_COMMUNITY)
Admission: EM | Admit: 2014-11-03 | Discharge: 2014-11-03 | Disposition: A | Discharge status: Home / Self Care | Payer: Medicaid Other | Attending: Emergency Medicine | Admitting: Emergency Medicine

## 2014-11-03 DIAGNOSIS — F1010 Alcohol abuse, uncomplicated: Secondary | ICD-10-CM | POA: Diagnosis present

## 2014-11-03 DIAGNOSIS — R220 Localized swelling, mass and lump, head: Secondary | ICD-10-CM | POA: Diagnosis not present

## 2014-11-03 DIAGNOSIS — Z720 Tobacco use: Secondary | ICD-10-CM | POA: Insufficient documentation

## 2014-11-03 LAB — COMPREHENSIVE METABOLIC PANEL
ALT: 12 U/L — AB (ref 17–63)
AST: 22 U/L (ref 15–41)
Albumin: 3.4 g/dL — ABNORMAL LOW (ref 3.5–5.0)
Alkaline Phosphatase: 53 U/L (ref 38–126)
Anion gap: 6 (ref 5–15)
BUN: 10 mg/dL (ref 6–20)
CHLORIDE: 101 mmol/L (ref 101–111)
CO2: 30 mmol/L (ref 22–32)
CREATININE: 0.7 mg/dL (ref 0.61–1.24)
Calcium: 8.9 mg/dL (ref 8.9–10.3)
GFR calc Af Amer: >60 mL/min (ref >60)
Glucose, Bld: 97 mg/dL (ref 65–99)
Potassium: 3.9 mmol/L (ref 3.5–5.1)
Sodium: 137 mmol/L (ref 135–145)
Total Bilirubin: 0.3 mg/dL (ref 0.3–1.2)
Total Protein: 7 g/dL (ref 6.5–8.1)

## 2014-11-03 LAB — CBC WITH DIFFERENTIAL/PLATELET
BASOS ABS: 0.1 10*3/uL (ref 0.0–0.1)
Basophils Relative: 1 % (ref 0–1)
EOS PCT: 4 % (ref 0–5)
Eosinophils Absolute: 0.4 10*3/uL (ref 0.0–0.7)
HEMATOCRIT: 39.5 % (ref 39.0–52.0)
HEMOGLOBIN: 13.6 g/dL (ref 13.0–17.0)
LYMPHS PCT: 16 % (ref 12–46)
Lymphs Abs: 1.6 10*3/uL (ref 0.7–4.0)
MCH: 30.8 pg (ref 26.0–34.0)
MCHC: 34.4 g/dL (ref 30.0–36.0)
MCV: 89.6 fL (ref 78.0–100.0)
Monocytes Absolute: 1.1 10*3/uL — ABNORMAL HIGH (ref 0.1–1.0)
Monocytes Relative: 11 % (ref 3–12)
NEUTROS ABS: 6.7 10*3/uL (ref 1.7–7.7)
NEUTROS PCT: 68 % (ref 43–77)
PLATELETS: 229 10*3/uL (ref 150–400)
RBC: 4.41 MIL/uL (ref 4.22–5.81)
RDW: 12.5 % (ref 11.5–15.5)
WBC: 9.9 10*3/uL (ref 4.0–10.5)

## 2014-11-03 MED ORDER — CLINDAMYCIN HCL 150 MG PO CAPS: 300.0000 mg | ORAL_CAPSULE | Freq: Four times a day (QID) | ORAL | Status: AC

## 2014-11-03 MED ORDER — NAPROXEN 500 MG PO TABS: 500.0000 mg | ORAL_TABLET | Freq: Two times a day (BID) | ORAL | Status: AC

## 2014-11-03 MED ORDER — IOHEXOL 300 MG/ML  SOLN
75.0000 mL | Freq: Once | INTRAMUSCULAR | Status: AC | PRN
  Administered 2014-11-03: 75 mL via INTRAVENOUS

## 2014-11-03 NOTE — ED Notes (Signed)
Pt c/o increasingly worse facial swelling to lower jaw and chin x 2 weeks. Pt c/o of difficulty chewing. Pt states he was treated for dental abscess two months ago. Denies N/V. Denies fever/chills. Airway patent.

## 2014-11-03 NOTE — Discharge Instructions (Signed)
Follow up with Dr. Buelah Manis. Call on Tuesday to get in. He should have your information.

## 2014-11-03 NOTE — ED Provider Notes (Signed)
CSN: [id number]     Arrival date & time 11/03/14  1753 History   First MD Initiated Contact with Patient 11/03/14 1806     Chief Complaint  Patient presents with  . Facial Swelling     (Consider location/radiation/quality/duration/timing/severity/associated sxs/prior Treatment) The history is provided by the patient.   patient was seen in the ER around 3 months ago for a dental infection. Get clindamycin at that time. States it improved a little bit at that time but not especially got worse. Now more swelling. No fevers. States it is difficult to eat due to pain and swelling. States it was not swollen before. No difficulty swallowing. Does not see doctors otherwise.  Past Medical History  Diagnosis Date  . ETOH abuse    History reviewed. No pertinent past surgical history. No family history on file. Social History  Substance Use Topics  . Smoking status: Current Every Day Smoker -- 1.50 packs/day for 20 years    Types: Cigarettes  . Smokeless tobacco: None  . Alcohol Use: 6.0 - 9.0 oz/week    10-15 Cans of beer per week     Comment: heavily daily    Review of Systems  Constitutional: Negative for fever and appetite change.  HENT: Positive for facial swelling. Negative for mouth sores, sore throat and trouble swallowing.   Respiratory: Negative for shortness of breath.   Cardiovascular: Negative for chest pain.  Gastrointestinal: Negative for abdominal pain.  Musculoskeletal: Negative for back pain.  Skin: Negative for wound.  Neurological: Negative for numbness.      Allergies  Review of patient's allergies indicates no known allergies.  Home Medications   Prior to Admission medications   Medication Sig Start Date End Date Taking? Authorizing Provider  clindamycin (CLEOCIN) 150 MG capsule Take 2 capsules (300 mg total) by mouth 4 (four) times daily. For 7 days 11/03/14   Davonna Belling, MD  naproxen (NAPROSYN) 500 MG tablet Take 1 tablet (500 mg total) by mouth 2  (two) times daily with a meal. 11/03/14   Davonna Belling, MD   BP 142/83 mmHg  Pulse 58  Temp(Src) 98.4 F (36.9 C) (Oral)  Resp 18  Ht 5\' 6"  (1.676 m)  Wt 130 lb (58.968 kg)  BMI 20.99 kg/m2  SpO2 100% Physical Exam  Constitutional: He appears well-developed.  HENT:  Head: Atraumatic.  Mouth/Throat: No oropharyngeal exudate.  Large swelling in area of anterior mandible. No drainage. Is erythematous and firm. Multiple anterior lower teeth are loose. No active drainage. There is swelling of the floor the mouth and firmness of the tongue.  Cardiovascular: Normal rate.   Pulmonary/Chest: Effort normal.  Abdominal: Soft.  Neurological: He is alert.        ED Course  Procedures (including critical care time) Labs Review Labs Reviewed  CBC WITH DIFFERENTIAL/PLATELET - Abnormal; Notable for the following:    Monocytes Absolute 1.1 (*)    All other components within normal limits  COMPREHENSIVE METABOLIC PANEL - Abnormal; Notable for the following:    Albumin 3.4 (*)    ALT 12 (*)    All other components within normal limits    Imaging Review Ct Soft Tissue Neck W Contrast  11/03/2014   CLINICAL DATA:  Worsening facial swelling anterior to mandible. Rule out abscess. Prior treatment with antibiotics  EXAM: CT NECK WITH CONTRAST  TECHNIQUE: Multidetector CT imaging of the neck was performed using the standard protocol following the bolus administration of intravenous contrast.  CONTRAST:  3mL OMNIPAQUE  IOHEXOL 300 MG/ML  SOLN  COMPARISON:  CT neck 08/03/2014  FINDINGS: Pharynx and larynx: Normal oropharynx. Normal epiglottis and larynx.  Salivary glands: 4 mm cyst in the left posterior parotid gland. No other parotid mass. Submandibular gland normal bilaterally.  Thyroid: Normal  Lymph nodes: No pathologic adenopathy identified.  Vascular: Mild atherosclerotic disease in the carotid bifurcation bilaterally. Carotid artery and jugular vein patent bilaterally.  Limited intracranial:  Negative  Visualized orbits: Negative  Mastoids and visualized paranasal sinuses: Clear  Skeleton: Destructive mass lesion involving the anterior mandible has progressed significantly in the interval. Multiple floating lower teeth are present not attached to bone. Aggressive type bony destruction of the anterior mandible. Large soft tissue mass anterior to the symphysis of the mandible measuring 3.8 x 7.3 cm with marked interval growth since prior study. This appears to be a solid enhancing mass lesion compatible with tumor. No evidence of abscess. There may be some invasion into the floor of the mouth however this area is limited due to streak artifact.  Upper chest: Mild scarring in the apices.  No apical lung mass.  Subcutaneous cyst in the left lower neck measures 32 by 24 mm and is unchanged from the prior study. This appears benign.  IMPRESSION: Large destructive mass lesion involving the anterior mandible with progression since the prior CT of 08/03/2014. 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  These results were called by telephone at the time of interpretation on 11/03/2014 at 8:05 pm to Dr. Davonna Belling , who verbally acknowledged these results.   Electronically Signed   By: Franchot Gallo M.D.   On: 11/03/2014 20:06   I have personally reviewed and evaluated these images and lab results as part of my medical decision-making.   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 Does not have a primary care doctor he sees. Has no insurance. Also smokes and drinks heavily. Does not have good follow-up. Will likely require admission.  Discussed with hospitalist, Dr. Shanon Brow and oral maxillofacial surgeon Dr. Buelah Manis. Outpatient follow-up has been arranged. Discussed  possibility of infection on top of this wound and will be given antibiotics.    Davonna Belling, MD 11/03/14 2141

## 2014-11-03 NOTE — Consult Note (Signed)
PCP:   No primary care provider on file.   Chief Complaint:  Facial swelling  HPI: 54 yo male with 3 months of swelling to his chin area.  Saw a MD several months ago, diagnosed with absess and told to follow up with a dentist.  Pt was given a course of clindamycin which he completed but the mass continued to get bigger.  Never saw a dentist.  No fevers.  No drooling, no trouble breathing.  Comes in today for the mass, ct shows concerns for underlying malignancy.  Pt smokes, daily etoh use, no chewing tobacco.  Review of Systems:  Positive and negative as per HPI otherwise all other systems are negative  Past Medical History: Past Medical History  Diagnosis Date  . ETOH abuse    History reviewed. No pertinent past surgical history.  Medications: Prior to Admission medications   Medication Sig Start Date End Date Taking? Authorizing Provider  clindamycin (CLEOCIN) 150 MG capsule Take 2 capsules (300 mg total) by mouth 4 (four) times daily. For 7 days Patient not taking: Reported on 11/03/2014 08/03/14   Tammy Triplett, PA-C  naproxen (NAPROSYN) 500 MG tablet Take 1 tablet (500 mg total) by mouth 2 (two) times daily with a meal. Patient not taking: Reported on 11/03/2014 08/03/14   Tammy Triplett, PA-C    Allergies:  No Known Allergies  Social History:  reports that he has been smoking Cigarettes.  He has a 30 pack-year smoking history. He does not have any smokeless tobacco history on file. He reports that he drinks about 6.0 - 9.0 oz of alcohol per week. He reports that he does not use illicit drugs.  Family History: No cancer  Physical Exam: Filed Vitals:   11/03/14 1801 11/03/14 2034  BP: 136/81 142/83  Pulse: 63 58  Temp: 98.4 F (36.9 C)   TempSrc: Oral   Resp: 16 18  Height: 5\' 6"  (1.676 m)   Weight: 58.968 kg (130 lb)   SpO2: 100% 100%   General appearance: alert, cooperative and no distress Head: Normocephalic, without obvious abnormality, atraumatic except large  mandibular growth noted.  No respiratory compromise Eyes: negative Nose: Nares normal. Septum midline. Mucosa normal. No drainage or sinus tenderness. Neck: no JVD and supple, symmetrical, trachea midline Lungs: clear to auscultation bilaterally Heart: regular rate and rhythm, S1, S2 normal, no murmur, click, rub or gallop Abdomen: soft, non-tender; bowel sounds normal; no masses,  no organomegaly Extremities: extremities normal, atraumatic, no cyanosis or edema Pulses: 2+ and symmetric Skin: Skin color, texture, turgor normal. No rashes or lesions Neurologic: Grossly normal    Labs on Admission:   Recent Labs  11/03/14 1850  NA 137  K 3.9  CL 101  CO2 30  GLUCOSE 97  BUN 10  CREATININE 0.70  CALCIUM 8.9    Recent Labs  11/03/14 1850  AST 22  ALT 12*  ALKPHOS 53  BILITOT 0.3  PROT 7.0  ALBUMIN 3.4*    Recent Labs  11/03/14 1850  WBC 9.9  NEUTROABS 6.7  HGB 13.6  HCT 39.5  MCV 89.6  PLT 229    Radiological Exams on Admission: Ct Soft Tissue Neck W Contrast  11/03/2014   CLINICAL DATA:  Worsening facial swelling anterior to mandible. Rule out abscess. Prior treatment with antibiotics  EXAM: CT NECK WITH CONTRAST  TECHNIQUE: Multidetector CT imaging of the neck was performed using the standard protocol following the bolus administration of intravenous contrast.  CONTRAST:  52mL OMNIPAQUE IOHEXOL  300 MG/ML  SOLN  COMPARISON:  CT neck 08/03/2014  FINDINGS: Pharynx and larynx: Normal oropharynx. Normal epiglottis and larynx.  Salivary glands: 4 mm cyst in the left posterior parotid gland. No other parotid mass. Submandibular gland normal bilaterally.  Thyroid: Normal  Lymph nodes: No pathologic adenopathy identified.  Vascular: Mild atherosclerotic disease in the carotid bifurcation bilaterally. Carotid artery and jugular vein patent bilaterally.  Limited intracranial: Negative  Visualized orbits: Negative  Mastoids and visualized paranasal sinuses: Clear  Skeleton:  Destructive mass lesion involving the anterior mandible has progressed significantly in the interval. Multiple floating lower teeth are present not attached to bone. Aggressive type bony destruction of the anterior mandible. Large soft tissue mass anterior to the symphysis of the mandible measuring 3.8 x 7.3 cm with marked interval growth since prior study. This appears to be a solid enhancing mass lesion compatible with tumor. No evidence of abscess. There may be some invasion into the floor of the mouth however this area is limited due to streak artifact.  Upper chest: Mild scarring in the apices.  No apical lung mass.  Subcutaneous cyst in the left lower neck measures 32 by 24 mm and is unchanged from the prior study. This appears benign.  IMPRESSION: Large destructive mass lesion involving the anterior mandible with progression since the prior CT of 08/03/2014. 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  These results were called by telephone at the time of interpretation on 11/03/2014 at 8:05 pm to Dr. Davonna Belling , who verbally acknowledged these results.   Electronically Signed   By: Franchot Gallo M.D.   On: 11/03/2014 20:06    Assessment/Plan  54 yo male with large mandibular mass concerning for underlying malignancy  Principal Problem:   Mandibular mass-  No signs of superimposed infection, no fever and wbc nml, no absess on ct scan.  Would try to arrange outpatient follow up with ENT/OMFS, if this cannot be arranged would need transfer to [place] for biopsy if this can be done over weekend.  i have told the patient that this is highly likely a malignancy (cancer) and that he needs to follow up , he is aware and ensures me that he will follow up as outpatient if this can be arranged.   Active Problems:   Alcohol abuse, continuous  Please call for any  further questions.   Nik Gorrell A 11/03/2014, 9:30 PM

## 2014-11-24 ENCOUNTER — Other Ambulatory Visit (HOSPITAL_COMMUNITY): Payer: Self-pay | Admitting: Otolaryngology

## 2014-11-24 DIAGNOSIS — C049 Malignant neoplasm of floor of mouth, unspecified: Secondary | ICD-10-CM

## 2014-11-29 ENCOUNTER — Ambulatory Visit (HOSPITAL_COMMUNITY)
Admission: RE | Admit: 2014-11-29 | Discharge: 2014-11-29 | Disposition: A | Discharge status: Home / Self Care | Payer: Medicaid Other | Source: Ambulatory Visit | Attending: Otolaryngology | Admitting: Otolaryngology

## 2014-11-29 DIAGNOSIS — C049 Malignant neoplasm of floor of mouth, unspecified: Secondary | ICD-10-CM | POA: Diagnosis present

## 2014-11-29 DIAGNOSIS — K429 Umbilical hernia without obstruction or gangrene: Secondary | ICD-10-CM | POA: Insufficient documentation

## 2014-11-29 LAB — GLUCOSE, CAPILLARY: Glucose-Capillary: 92 mg/dL (ref 65–99)

## 2014-11-29 MED ORDER — FLUDEOXYGLUCOSE F - 18 (FDG) INJECTION
6.4000 | Freq: Once | INTRAVENOUS | Status: DC | PRN
  Administered 2014-11-29: 6.43 via INTRAVENOUS
  Filled 2014-11-29: qty 6.4

## 2014-11-30 ENCOUNTER — Ambulatory Visit: Payer: Self-pay

## 2014-12-04 ENCOUNTER — Ambulatory Visit (HOSPITAL_COMMUNITY): Payer: Self-pay

## 2015-03-09 ENCOUNTER — Emergency Department (HOSPITAL_COMMUNITY)
Admission: EM | Admit: 2015-03-09 | Discharge: 2015-03-09 | Disposition: A | Discharge status: Home / Self Care | Payer: Medicaid Other | Attending: Emergency Medicine | Admitting: Emergency Medicine

## 2015-03-09 ENCOUNTER — Encounter (HOSPITAL_COMMUNITY): Payer: Self-pay | Admitting: Emergency Medicine

## 2015-03-09 ENCOUNTER — Emergency Department (HOSPITAL_COMMUNITY): Payer: Medicaid Other

## 2015-03-09 DIAGNOSIS — M7022 Olecranon bursitis, left elbow: Secondary | ICD-10-CM | POA: Diagnosis not present

## 2015-03-09 DIAGNOSIS — F17210 Nicotine dependence, cigarettes, uncomplicated: Secondary | ICD-10-CM | POA: Diagnosis not present

## 2015-03-09 DIAGNOSIS — Y9389 Activity, other specified: Secondary | ICD-10-CM | POA: Insufficient documentation

## 2015-03-09 DIAGNOSIS — M25522 Pain in left elbow: Secondary | ICD-10-CM

## 2015-03-09 DIAGNOSIS — Z85818 Personal history of malignant neoplasm of other sites of lip, oral cavity, and pharynx: Secondary | ICD-10-CM | POA: Insufficient documentation

## 2015-03-09 DIAGNOSIS — Z79899 Other long term (current) drug therapy: Secondary | ICD-10-CM | POA: Insufficient documentation

## 2015-03-09 DIAGNOSIS — Z8781 Personal history of (healed) traumatic fracture: Secondary | ICD-10-CM | POA: Diagnosis not present

## 2015-03-09 HISTORY — DX: Malignant (primary) neoplasm, unspecified: C80.1

## 2015-03-09 LAB — CBC WITH DIFFERENTIAL/PLATELET
BASOS PCT: 1 %
Basophils Absolute: 0.1 10*3/uL (ref 0.0–0.1)
EOS ABS: 0.3 10*3/uL (ref 0.0–0.7)
EOS PCT: 2 %
HCT: 33.2 % — ABNORMAL LOW (ref 39.0–52.0)
Hemoglobin: 11.1 g/dL — ABNORMAL LOW (ref 13.0–17.0)
Lymphocytes Relative: 9 %
Lymphs Abs: 1.3 10*3/uL (ref 0.7–4.0)
MCH: 30.9 pg (ref 26.0–34.0)
MCHC: 33.4 g/dL (ref 30.0–36.0)
MCV: 92.5 fL (ref 78.0–100.0)
MONO ABS: 2 10*3/uL — AB (ref 0.1–1.0)
MONOS PCT: 14 %
Neutro Abs: 10.8 10*3/uL — ABNORMAL HIGH (ref 1.7–7.7)
Neutrophils Relative %: 74 %
Platelets: 242 10*3/uL (ref 150–400)
RBC: 3.59 MIL/uL — ABNORMAL LOW (ref 4.22–5.81)
RDW: 18.1 % — AB (ref 11.5–15.5)
WBC: 14.5 10*3/uL — ABNORMAL HIGH (ref 4.0–10.5)

## 2015-03-09 LAB — BASIC METABOLIC PANEL
Anion gap: 9 (ref 5–15)
BUN: 11 mg/dL (ref 6–20)
CALCIUM: 9 mg/dL (ref 8.9–10.3)
CO2: 28 mmol/L (ref 22–32)
CREATININE: 0.52 mg/dL — AB (ref 0.61–1.24)
Chloride: 101 mmol/L (ref 101–111)
GFR calc non Af Amer: >60 mL/min (ref >60)
Glucose, Bld: 93 mg/dL (ref 65–99)
Potassium: 4 mmol/L (ref 3.5–5.1)
SODIUM: 138 mmol/L (ref 135–145)

## 2015-03-09 MED ORDER — SULFAMETHOXAZOLE-TRIMETHOPRIM 800-160 MG PO TABS: 1.0000 | ORAL_TABLET | Freq: Two times a day (BID) | ORAL | Status: AC

## 2015-03-09 MED ORDER — OXYCODONE-ACETAMINOPHEN 5-325 MG PO TABS: 1.0000 | ORAL_TABLET | ORAL | Status: AC | PRN

## 2015-03-09 MED ORDER — OXYCODONE-ACETAMINOPHEN 5-325 MG PO TABS
1.0000 | ORAL_TABLET | Freq: Once | ORAL | Status: AC
  Administered 2015-03-09: 1 via ORAL
  Filled 2015-03-09: qty 1

## 2015-03-09 MED ORDER — SULFAMETHOXAZOLE-TRIMETHOPRIM 800-160 MG PO TABS: 1.0000 | ORAL_TABLET | Freq: Two times a day (BID) | ORAL | Status: DC

## 2015-03-09 MED ORDER — SULFAMETHOXAZOLE-TRIMETHOPRIM 800-160 MG PO TABS
1.0000 | ORAL_TABLET | Freq: Once | ORAL | Status: AC
  Administered 2015-03-09: 1 via ORAL
  Filled 2015-03-09: qty 1

## 2015-03-09 MED ORDER — OXYCODONE-ACETAMINOPHEN 5-325 MG PO TABS: 1.0000 | ORAL_TABLET | ORAL | Status: DC | PRN

## 2015-03-09 NOTE — ED Notes (Signed)
MD Zammit at bedside. 

## 2015-03-09 NOTE — ED Notes (Signed)
PA Almyra Free at bedside updating patient and family.

## 2015-03-09 NOTE — ED Provider Notes (Signed)
CSN: [id number]     Arrival date & time 03/09/15  1100 History   First MD Initiated Contact with Patient 03/09/15 1135     Chief Complaint  Patient presents with  . Arm Pain     (Consider location/radiation/quality/duration/timing/severity/associated sxs/prior Treatment) The history is provided by the patient.   Ryan Mcfarland is a 55 y.o. male presenting with left elbow pain which started 3 days ago.  He denies any new injuries but endorses a fracture to this elbow with surgical repair over 30 years ago.  His pain is described as aching and sharp and worsened with movement and palpation.  He is able to move the elbow but is very uncomfortable in full extension, presenting holding the extremity in 90 flexion.  He noticed a small draining wound on his posterior elbow 3 days ago.  He states it drained a lot of clear fluid, which has reduced significantly today.  He denies fevers or chills, no puncture, abrasions or other injuries to his skin over the site.  His had no medications for treatment of his symptoms prior to arrival.    Past Medical History  Diagnosis Date  . ETOH abuse   . 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 No pertinent past surgical history. History reviewed. No pertinent family history. Social History  Substance Use Topics  . Smoking status: Current Every Day Smoker -- 1.50 packs/day for 20 years    Types: Cigarettes  . Smokeless tobacco: None  . Alcohol Use: 6.0 - 9.0 oz/week    10-15 Cans of beer per week     Comment: heavily daily    Review of Systems  Constitutional: Negative for fever.  Musculoskeletal: Positive for joint swelling and arthralgias. Negative for myalgias.  Skin: Positive for wound.  Neurological: Negative for weakness and numbness.      Allergies  Review of patient's allergies indicates no known allergies.  Home Medications   Prior to Admission medications   Medication Sig Start Date End Date Taking? Authorizing Provider   folic acid (FOLVITE) 1 MG tablet Take 1 mg by mouth. 12/26/14  Yes Historical Provider, MD  magnesium oxide (MAGOX 400) 400 (241.3 Mg) MG tablet Take 400 mg by mouth. 01/16/15  Yes Historical Provider, MD  ondansetron (ZOFRAN) 8 MG tablet Take 8 mg by mouth every 8 (eight) hours as needed.  12/28/14  Yes Historical Provider, MD  Oxycodone HCl 10 MG TABS Take 10 mg by mouth every 4 (four) hours.  02/22/15  Yes Historical Provider, MD  potassium chloride SA (K-DUR,KLOR-CON) 20 MEQ tablet Take 20 mEq by mouth once.  02/11/15  Yes Historical Provider, MD  senna (SENOKOT) 8.6 MG tablet Take 8.6 mg by mouth daily.  02/11/15  Yes Historical Provider, MD  thiamine 50 MG tablet Take 50 mg by mouth daily.   Yes Historical Provider, MD  clindamycin (CLEOCIN) 150 MG capsule Take 2 capsules (300 mg total) by mouth 4 (four) times daily. For 7 days Patient not taking: Reported on 03/09/2015 11/03/14   Davonna Belling, MD  naproxen (NAPROSYN) 500 MG tablet Take 1 tablet (500 mg total) by mouth 2 (two) times daily with a meal. Patient not taking: Reported on 03/09/2015 11/03/14   Davonna Belling, MD  oxyCODONE-acetaminophen (PERCOCET/ROXICET) 5-325 MG tablet Take 1 tablet by mouth every 4 (four) hours as needed. 03/09/15   Evalee Jefferson, PA-C  sulfamethoxazole-trimethoprim (BACTRIM DS,SEPTRA DS) 800-160 MG tablet Take 1 tablet by mouth 2 (two)  times daily. 03/09/15 03/16/15  Evalee Jefferson, PA-C   BP 131/89 mmHg  Pulse 74  Temp(Src) 98.3 F (36.8 C) (Oral)  Resp 16  Ht 5\' 7"  (1.702 m)  Wt 53.978 kg  BMI 18.63 kg/m2  SpO2 99% Physical Exam  Constitutional: He appears well-developed and well-nourished.  HENT:  Head: Atraumatic.  Neck: Normal range of motion.  Cardiovascular:  Pulses equal bilaterally  Musculoskeletal: He exhibits tenderness.       Left elbow: He exhibits decreased range of motion and swelling. He exhibits no effusion and no deformity. Tenderness found. Olecranon process tenderness noted.  Pain with  palpation of the left olecranon.  Mild edema, no erythema or increased warmth at the joint.  0.3 cm ulceration at the olecranon with trace of clear drainage.  No red streaking, no erythema.  He has full active flexion, pronation and supination,  Unwilling to actively extend to 180.  Passive full extension with mild increased pain.  Neurological: He is alert. He has normal strength. He displays normal reflexes. No sensory deficit.  Skin: Skin is warm and dry.  Psychiatric: He has a normal mood and affect.    ED Course  Procedures (including critical care time) Labs Review Labs Reviewed  CBC WITH DIFFERENTIAL/PLATELET - Abnormal; Notable for the following:    WBC 14.5 (*)    RBC 3.59 (*)    Hemoglobin 11.1 (*)    HCT 33.2 (*)    RDW 18.1 (*)    Neutro Abs 10.8 (*)    Monocytes Absolute 2.0 (*)    All other components within normal limits  BASIC METABOLIC PANEL - Abnormal; Notable for the following:    Creatinine, Ser 0.52 (*)    All other components within normal limits    Imaging Review Dg Elbow 2 Views Left  03/09/2015  CLINICAL DATA:  Posterior left elbow pain. History of fracture fixation. The patient feels as if the pins are sticking out of the elbow. EXAM: LEFT ELBOW - 2 VIEW COMPARISON:  None. FINDINGS: The patient is status post fixation of a proximal ulnar fracture with 2 pins, cerclage wire and a single screw. The wires are fractured and project into the posterior soft tissues of the elbow. The fixation pins also project into the soft tissues. There is soft tissue swelling about the posterior aspect of the elbow. The patient's fracture is well healed. No acute bony abnormality is identified. IMPRESSION: Status post fixation of a proximal ulnar fracture. Fixation wire was broken with its ends projecting into the dorsal soft tissues of the elbow. Fixation pins also project into the soft tissues. The fracture is well healed. Soft tissue swelling posterior to the elbow most consistent  with olecranon bursitis. No acute abnormality. Electronically Signed   By: Inge Rise M.D.   On: 03/09/2015 11:50   I have personally reviewed and evaluated these images and lab results as part of my medical decision-making.   EKG Interpretation None      MDM   Final diagnoses:  Elbow pain, left  Olecranon bursitis of left elbow   Patients  labs reviewed.  Radiological studies were viewed, interpreted and considered during the medical decision making and disposition process. I agree with radiologists reading.  Results were also discussed with patient.  Pt was also seen by Dr Roderic Palau with plan discussed.  Exam is most consistent with olecranon bursitis but with drainage and hardware in the joint, unclear if there could be a joint infection or if sx are  related to apparent fractures fixation wires, although we do not know how long the fractured wires have been present. He has fair movement in the joint.  Discussed with Dr. Grandville Silos.  Will plan f/u here in 2 days for recheck, in the interim, bactrim bid, first dose given here.  Pt lives 5 minutes from here, can return without problem.  Advised if his sx are worsened in any way, he should arrive npo in event he needs surgical intervention.  Prescribed oxycodone for pain relief. Advised warm compresses, bid wash and dressing changes.    Evalee Jefferson, PA-C 03/09/15 [place], MD 03/09/15 [phone]

## 2015-03-09 NOTE — ED Notes (Signed)
Pt reports left arm pain in elbow region x3 days with no injury.  Pt broke this arm in the 80's.  Has limited ROM.

## 2015-03-09 NOTE — Discharge Instructions (Signed)
Elbow Bursitis Elbow bursitis is inflammation of the fluid-filled sac (bursa) between the tip of your elbow bone (olecranon) and your skin. Elbow bursitis may also be called olecranon bursitis. Normally, the olecranon bursa has only a small amount of fluid in it to cushion and protect your elbow bone. Elbow bursitis causes fluid to build up inside the bursa. Over time, this swelling and inflammation can cause pain when you bend or lean on your elbow.  CAUSES Elbow bursitis may be caused by:   Elbow injury (acute trauma).  Leaning on hard surfaces for long periods of time.  Infection from an injury that breaks the skin near your elbow.  A bone growth (spur) that forms at the tip of your elbow.  A medical condition that causes inflammation in your body, such as gout or rheumatoid arthritis.  The cause may also be unknown.  SIGNS AND SYMPTOMS  The first sign of elbow bursitis is usually swelling over the tip of your elbow. This can grow to be the size of a golf ball. This may start suddenly or develop gradually. You may also have:  Pain when bending or leaning on your elbow.  Restricted movement of your elbow.  If your bursitis is caused by an infection, symptoms may also include:  Redness, warmth, and tenderness of the elbow.  Drainage of pus from the swollen area over your elbow, if the skin breaks open. DIAGNOSIS  Your health care provider may be able to diagnose elbow bursitis based on your signs and symptoms, especially if you have recently been injured. Your health care provider will also do a physical exam. This may include:  X-rays to look for a bone spur or a bone fracture.  Draining fluid from the bursa to test it for infection.  Blood tests to rule out gout or rheumatoid arthritis. TREATMENT  Treatment for elbow bursitis depends on the cause. Treatment may include:  Medicines. These may include:  Over-the-counter medicines to relieve pain and  inflammation.  Antibiotic medicines to fight infection.  Injections of anti-inflammatory medicines (steroids).  Wrapping your elbow with a bandage.  Draining fluid from the bursa.  Wearing elbow pads.  If your bursitis does not get better with treatment, surgery may be needed to remove the bursa.  HOME CARE INSTRUCTIONS   Take medicines only as directed by your health care provider.  If you were prescribed an antibiotic medicine, finish all of it even if you start to feel better.  If your bursitis is caused by an injury, rest your elbow and wear your bandage as directed by your health care provider. You may alsoapply ice to the injured area as directed by your health care provider:  Put ice in a plastic bag.  Place a towel between your skin and the bag.  Leave the ice on for 20 minutes, 2-3 times per day.  Avoid any activities that cause elbow pain.  U    se elbow pads or elbow wraps to cushion your elbow. SEEK MEDICAL CARE IF:  You have a fever.   Your symptoms do not get better with treatment.  Your pain or swelling gets worse.  Your elbow pain or swelling goes away and then returns.  You have drainage of pus from the swollen area over your elbow.   This information is not intended to replace advice given to you by your health care provider. Make sure you discuss any questions you have with your health care provider.   Document Released: 03/19/2006  Document Revised: 03/10/2014 Document Reviewed: 10/26/2013 Elsevier Interactive Patient Education 2016 [place] your wound covered and clean, wash twice daily for mild soap and water, then dry completely and bandage.  Take your next dose of antibiotics tonight.  You may take the oxycodone prescribed for pain relief.  This will make you drowsy - do not drive within 4 hours of taking this medication.  As discussed,  You need to return here in 2 days for a recheck.  If your symptoms are not improving or  worsened,  You may need to be seen by our orthopedic specialist, Dr. Grandville Silos in [place] on Sunday.  Do not eat or drink after midnight Saturday in case your re-exam suggests you need to have surgery of your elbow on Sunday.

## 2015-03-11 ENCOUNTER — Emergency Department (HOSPITAL_COMMUNITY)
Admission: EM | Admit: 2015-03-11 | Discharge: 2015-03-11 | Disposition: A | Discharge status: Home / Self Care | Payer: Medicaid Other | Attending: Emergency Medicine | Admitting: Emergency Medicine

## 2015-03-11 ENCOUNTER — Encounter (HOSPITAL_COMMUNITY): Payer: Self-pay | Admitting: Emergency Medicine

## 2015-03-11 DIAGNOSIS — Z79899 Other long term (current) drug therapy: Secondary | ICD-10-CM | POA: Diagnosis not present

## 2015-03-11 DIAGNOSIS — M7022 Olecranon bursitis, left elbow: Secondary | ICD-10-CM | POA: Insufficient documentation

## 2015-03-11 DIAGNOSIS — Z4801 Encounter for change or removal of surgical wound dressing: Secondary | ICD-10-CM | POA: Diagnosis present

## 2015-03-11 DIAGNOSIS — Z85818 Personal history of malignant neoplasm of other sites of lip, oral cavity, and pharynx: Secondary | ICD-10-CM | POA: Insufficient documentation

## 2015-03-11 DIAGNOSIS — Z9889 Other specified postprocedural states: Secondary | ICD-10-CM | POA: Diagnosis not present

## 2015-03-11 DIAGNOSIS — Y9389 Activity, other specified: Secondary | ICD-10-CM | POA: Insufficient documentation

## 2015-03-11 DIAGNOSIS — F17210 Nicotine dependence, cigarettes, uncomplicated: Secondary | ICD-10-CM | POA: Diagnosis not present

## 2015-03-11 NOTE — ED Notes (Signed)
MD at bedside. 

## 2015-03-11 NOTE — ED Provider Notes (Signed)
CSN: [id number]     Arrival date & time 03/11/15  0751 History   First MD Initiated Contact with Patient 03/11/15 [phone]     Chief Complaint  Patient presents with  . Wound Check     (Consider location/radiation/quality/duration/timing/severity/associated sxs/prior Treatment) HPI Comments: 55 y.o. Male presents for recheck of his left elbow.  He was seen here two days ago and diagnosed with what was thought to be likely olecranon bursitis with infection.  At that time it was not felt to be a septic elbow joint.  The case was discussed with Dr. Grandville Silos from orthopedics and xray was obtained that was consistent with bursitis and then also showed a broken wire from previous surgery to the elbow.  Patient was discharged with pain medication and Bactrim.  Patient reports that elbow feels much better.  He says he is able to almost fully extend the elbow which is significantly improved from the other day.  Denies fever or chills.  Says that if he squeezes on his elbow he can still get some clear fluid out of the wound but denies any active drainage or foul smelling or thick fluid.  Patient reports feeling over all he feels better.  Patient is a 55 y.o. male presenting with wound check.  Wound Check    Past Medical History  Diagnosis Date  . ETOH abuse   . [facility])     tumor under jaw   Past Surgical History  Procedure Laterality Date  . Elbow surgery     History reviewed. No pertinent family history. Social History  Substance Use Topics  . Smoking status: Current Every Day Smoker -- 1.50 packs/day for 20 years    Types: Cigarettes  . Smokeless tobacco: None  . Alcohol Use: No     Comment: a few weeks ago    Review of Systems  Constitutional: Negative for fever, chills and fatigue.  Gastrointestinal: Negative for nausea and vomiting.  Musculoskeletal: Positive for joint swelling (posterior left elbow) and arthralgias (left elbow pain, improved).  Skin: Positive for wound (small on  posterior elbow).  Hematological: Does not bruise/bleed easily.      Allergies  Review of patient's allergies indicates no known allergies.  Home Medications   Prior to Admission medications   Medication Sig Start Date End Date Taking? Authorizing Provider  clindamycin (CLEOCIN) 150 MG capsule Take 2 capsules (300 mg total) by mouth 4 (four) times daily. For 7 days Patient not taking: Reported on 03/09/2015 11/03/14   Davonna Belling, MD  folic acid (FOLVITE) 1 MG tablet Take 1 mg by mouth. 12/26/14   Historical Provider, MD  magnesium oxide (MAGOX 400) 400 (241.3 Mg) MG tablet Take 400 mg by mouth. 01/16/15   Historical Provider, MD  naproxen (NAPROSYN) 500 MG tablet Take 1 tablet (500 mg total) by mouth 2 (two) times daily with a meal. Patient not taking: Reported on 03/09/2015 11/03/14   Davonna Belling, MD  ondansetron (ZOFRAN) 8 MG tablet Take 8 mg by mouth every 8 (eight) hours as needed.  12/28/14   Historical Provider, MD  Oxycodone HCl 10 MG TABS Take 10 mg by mouth every 4 (four) hours.  02/22/15   Historical Provider, MD  oxyCODONE-acetaminophen (PERCOCET/ROXICET) 5-325 MG tablet Take 1 tablet by mouth every 4 (four) hours as needed. 03/09/15   Evalee Jefferson, PA-C  potassium chloride SA (K-DUR,KLOR-CON) 20 MEQ tablet Take 20 mEq by mouth once.  02/11/15   Historical Provider, MD  senna (SENOKOT) 8.6 MG  tablet Take 8.6 mg by mouth daily.  02/11/15   Historical Provider, MD  sulfamethoxazole-trimethoprim (BACTRIM DS,SEPTRA DS) 800-160 MG tablet Take 1 tablet by mouth 2 (two) times daily. 03/09/15 03/16/15  Evalee Jefferson, PA-C  thiamine 50 MG tablet Take 50 mg by mouth daily.    Historical Provider, MD   BP 112/84 mmHg  Pulse 120  Temp(Src) 98.3 F (36.8 C) (Axillary)  Resp 18  Ht 5\' 10"  (1.778 m)  Wt 120 lb (54.432 kg)  BMI 17.22 kg/m2  SpO2 100% Physical Exam  Constitutional: He is oriented to person, place, and time. He appears well-developed and well-nourished. No distress.  HENT:   Head: Normocephalic and atraumatic.  Right Ear: External ear normal.  Left Ear: External ear normal.  Mouth/Throat: Oropharynx is clear and moist. No oropharyngeal exudate.  Eyes: EOM are normal. Pupils are equal, round, and reactive to light.  Neck: Normal range of motion. Neck supple.  Cardiovascular: Normal rate, regular rhythm, normal heart sounds and intact distal pulses.   No murmur heard. Pulmonary/Chest: Effort normal. No respiratory distress. He has no wheezes. He has no rales.  Abdominal: Soft. He exhibits no distension. There is no tenderness.  Musculoskeletal: He exhibits no edema.       Left elbow: He exhibits decreased range of motion (mild decreased extension but almost able to fully extend to 180 degrees actively and passively able to be fully extended) and swelling (dorsal to the elbow joint, no joint swelling). He exhibits no effusion. No tenderness found.  Swelling of the left olecranon bursa without erythema.  No active drainage.  Patient with complete range of motion of elbow without pain except for very slight decrease in extension although passively elbow can be fully extended.  No active drainage from small wound on dorsum of elbow.  Neurological: He is alert and oriented to person, place, and time.  Skin: Skin is warm and dry. No rash noted. He is not diaphoretic.  Vitals reviewed.   ED Course  Procedures (including critical care time) Labs Review Labs Reviewed - No data to display  Imaging Review Dg Elbow 2 Views Left  03/09/2015  CLINICAL DATA:  Posterior left elbow pain. History of fracture fixation. The patient feels as if the pins are sticking out of the elbow. EXAM: LEFT ELBOW - 2 VIEW COMPARISON:  None. FINDINGS: The patient is status post fixation of a proximal ulnar fracture with 2 pins, cerclage wire and a single screw. The wires are fractured and project into the posterior soft tissues of the elbow. The fixation pins also project into the soft tissues.  There is soft tissue swelling about the posterior aspect of the elbow. The patient's fracture is well healed. No acute bony abnormality is identified. IMPRESSION: Status post fixation of a proximal ulnar fracture. Fixation wire was broken with its ends projecting into the dorsal soft tissues of the elbow. Fixation pins also project into the soft tissues. The fracture is well healed. Soft tissue swelling posterior to the elbow most consistent with olecranon bursitis. No acute abnormality. Electronically Signed   By: Inge Rise M.D.   On: 03/09/2015 11:50   I have personally reviewed and evaluated these images and lab results as part of my medical decision-making.   EKG Interpretation None      MDM  Patient was seen and evaluated in stable condition. Per patient elbow improved. Per documentation in Epic the patient's examination has greatly improved. Patient well appearing. Nontoxic. Afebrile. Patient felt comfortable with  plan for discharge and orthopedic follow-up outpatient. He was instructed to continue pain medication and prescriptions as directed 2 days ago. Patient was discharged home in stable condition. Final diagnoses:  None    1. Olecranon bursitis    Harvel Quale, MD 03/11/15 1640

## 2015-03-11 NOTE — ED Notes (Signed)
PT stated was in ED for bursitis x2 days ago and started on antibiotics and was told to f/u in ED x2 days ago.

## 2015-03-11 NOTE — Discharge Instructions (Signed)
You were seen here today for recheck of your left elbow. It appears to be improving. Continue to take the antibiotics prescribed as well as to use the pain medicine prescribed. Make a follow-up appointment with the orthopedic specialist whose information is provided.  Elbow Bursitis Elbow bursitis is inflammation of the fluid-filled sac (bursa) between the tip of your elbow bone (olecranon) and your skin. Elbow bursitis may also be called olecranon bursitis. Normally, the olecranon bursa has only a small amount of fluid in it to cushion and protect your elbow bone. Elbow bursitis causes fluid to build up inside the bursa. Over time, this swelling and inflammation can cause pain when you bend or lean on your elbow.  CAUSES Elbow bursitis may be caused by:   Elbow injury (acute trauma).  Leaning on hard surfaces for long periods of time.  Infection from an injury that breaks the skin near your elbow.  A bone growth (spur) that forms at the tip of your elbow.  A medical condition that causes inflammation in your body, such as gout or rheumatoid arthritis.  The cause may also be unknown.  SIGNS AND SYMPTOMS  The first sign of elbow bursitis is usually swelling over the tip of your elbow. This can grow to be the size of a golf ball. This may start suddenly or develop gradually. You may also have:  Pain when bending or leaning on your elbow.  Restricted movement of your elbow.  If your bursitis is caused by an infection, symptoms may also include:  Redness, warmth, and tenderness of the elbow.  Drainage of pus from the swollen area over your elbow, if the skin breaks open. DIAGNOSIS  Your health care provider may be able to diagnose elbow bursitis based on your signs and symptoms, especially if you have recently been injured. Your health care provider will also do a physical exam. This may include:  X-rays to look for a bone spur or a bone fracture.  Draining fluid from the bursa to  test it for infection.  Blood tests to rule out gout or rheumatoid arthritis. TREATMENT  Treatment for elbow bursitis depends on the cause. Treatment may include:  Medicines. These may include:  Over-the-counter medicines to relieve pain and inflammation.  Antibiotic medicines to fight infection.  Injections of anti-inflammatory medicines (steroids).  Wrapping your elbow with a bandage.  Draining fluid from the bursa.  Wearing elbow pads.  If your bursitis does not get better with treatment, surgery may be needed to remove the bursa.  HOME CARE INSTRUCTIONS   Take medicines only as directed by your health care provider.  If you were prescribed an antibiotic medicine, finish all of it even if you start to feel better.  If your bursitis is caused by an injury, rest your elbow and wear your bandage as directed by your health care provider. You may alsoapply ice to the injured area as directed by your health care provider:  Put ice in a plastic bag.  Place a towel between your skin and the bag.  Leave the ice on for 20 minutes, 2-3 times per day.  Avoid any activities that cause elbow pain.  Use elbow pads or elbow wraps to cushion your elbow. SEEK MEDICAL CARE IF:  You have a fever.   Your symptoms do not get better with treatment.  Your pain or swelling gets worse.  Your elbow pain or swelling goes away and then returns.  You have drainage of pus from the swollen  area over your elbow.   This information is not intended to replace advice given to you by your health care provider. Make sure you discuss any questions you have with your health care provider.   Document Released: 03/19/2006 Document Revised: 03/10/2014 Document Reviewed: 10/26/2013 Elsevier Interactive Patient Education [employer].

## 2015-06-02 DEATH — deceased

## 2016-04-23 IMAGING — PT NM PET TUM IMG INITIAL (PI) SKULL BASE T - THIGH
1 of 8 series · 1 of 25 positions shown · non-contrast
Comparison: CT neck 11/03/2014.

CLINICAL DATA: Initial treatment strategy for squamous cell
carcinoma of floor of mouth.

EXAM:
NUCLEAR MEDICINE PET SKULL BASE TO THIGH
TECHNIQUE: 6.4 mCi F-18 FDG was injected intravenously. Full-ring PET imaging
was performed from the skull base to thigh after the radiotracer. CT
data was obtained and used for attenuation correction and anatomic
localization.
FASTING BLOOD GLUCOSE:  Value: 92 mg/dl

[Series 4: ct hn_sk_th 5.0 hd_fov · axial · 5.0mm · 1.04mm/px · 1 of 208 slices shown]
[im 208/208  brain]
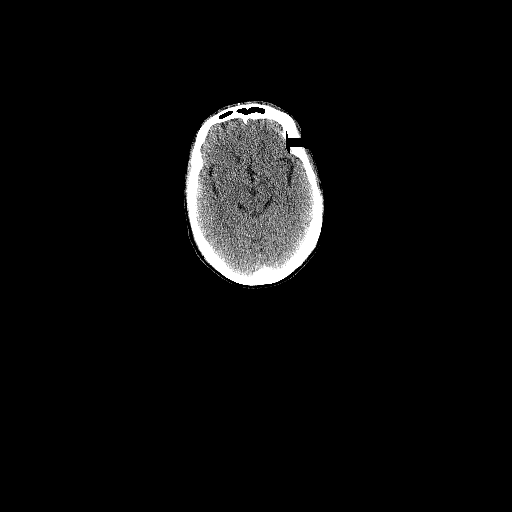

[1 of 25 positions shown; findings below may reference images not displayed]

FINDINGS: NECK

A destructive soft tissue mass centered in the anterior mandible has
an SUV max of 16.9. Lesion is better measured on diagnostic study
performed 11/03/2014. Hypermetabolic bilateral level-II lymph nodes
measure up to approximately 10 mm in short axis on the right (CT
image 28) with an SUV max of 7.1. CT images show no acute findings.
A 3.3 cm subcutaneous cyst along the lower left neck is again noted.

CHEST

No hypermetabolic mediastinal, hilar or axillary lymph nodes. No
hypermetabolic pulmonary nodules. CT images show no pericardial or
pleural effusion.

ABDOMEN/PELVIS

No abnormal hypermetabolism in the liver, adrenal glands, spleen or
pancreas. No hypermetabolic lymph nodes. CT images show the liver,
gallbladder, adrenal glands, kidneys, spleen, pancreas, stomach and
bowel to be grossly unremarkable. Small periumbilical hernia
contains fat. Prostate is within normal limits for size.

SKELETON

No abnormal osseous hypermetabolism, other than that associated with
the lytic destructive anterior mandibular mass discussed above.
IMPRESSION: 1. Hypermetabolic anterior mandibular mass with associated lytic
destruction, consistent with the given history of squamous cell
carcinoma. Associated hypermetabolic bilateral level 2 lymph nodes.
